# Patient Record
Sex: Male | Born: 1975 | Race: Black or African American | Hispanic: No | Marital: Married | Smoking: Current every day smoker
Health system: Southern US, Community
[De-identification: ages and names within clinical notes are randomized; demographics above are authoritative.]

## PROBLEM LIST (undated history)

## (undated) DIAGNOSIS — I10 Essential (primary) hypertension: Secondary | ICD-10-CM

---

## 2001-05-19 ENCOUNTER — Encounter (HOSPITAL_COMMUNITY): Admission: RE | Admit: 2001-05-19 | Discharge: 2001-06-18 | Payer: Self-pay | Admitting: Preventative Medicine

## 2001-05-29 ENCOUNTER — Encounter: Payer: Self-pay | Admitting: Preventative Medicine

## 2001-11-02 ENCOUNTER — Emergency Department (HOSPITAL_COMMUNITY): Admission: EM | Admit: 2001-11-02 | Discharge: 2001-11-02 | Payer: Self-pay | Admitting: Internal Medicine

## 2004-11-22 ENCOUNTER — Emergency Department (HOSPITAL_COMMUNITY): Admission: EM | Admit: 2004-11-22 | Discharge: 2004-11-22 | Payer: Self-pay | Admitting: Emergency Medicine

## 2009-10-20 ENCOUNTER — Emergency Department (HOSPITAL_COMMUNITY): Admission: EM | Admit: 2009-10-20 | Discharge: 2009-10-20 | Payer: Self-pay | Admitting: Emergency Medicine

## 2010-07-28 ENCOUNTER — Emergency Department (HOSPITAL_COMMUNITY): Admission: EM | Admit: 2010-07-28 | Discharge: 2010-07-28 | Payer: Self-pay | Admitting: Emergency Medicine

## 2010-08-13 ENCOUNTER — Emergency Department (HOSPITAL_COMMUNITY)
Admission: EM | Admit: 2010-08-13 | Discharge: 2010-08-13 | Payer: Self-pay | Source: Home / Self Care | Admitting: Emergency Medicine

## 2010-08-27 ENCOUNTER — Emergency Department (HOSPITAL_COMMUNITY)
Admission: EM | Admit: 2010-08-27 | Discharge: 2010-08-27 | Payer: Self-pay | Source: Home / Self Care | Admitting: Emergency Medicine

## 2010-10-18 ENCOUNTER — Emergency Department (HOSPITAL_COMMUNITY)
Admission: EM | Admit: 2010-10-18 | Discharge: 2010-10-18 | Disposition: A | Discharge status: Home / Self Care | Payer: Self-pay | Attending: Emergency Medicine | Admitting: Emergency Medicine

## 2010-10-18 DIAGNOSIS — Z760 Encounter for issue of repeat prescription: Secondary | ICD-10-CM | POA: Insufficient documentation

## 2010-10-18 DIAGNOSIS — I10 Essential (primary) hypertension: Secondary | ICD-10-CM | POA: Insufficient documentation

## 2010-10-18 DIAGNOSIS — K029 Dental caries, unspecified: Secondary | ICD-10-CM | POA: Insufficient documentation

## 2010-10-18 DIAGNOSIS — K089 Disorder of teeth and supporting structures, unspecified: Secondary | ICD-10-CM | POA: Insufficient documentation

## 2010-11-03 ENCOUNTER — Emergency Department (HOSPITAL_COMMUNITY)
Admission: EM | Admit: 2010-11-03 | Discharge: 2010-11-03 | Disposition: A | Discharge status: Home / Self Care | Payer: Self-pay | Attending: Emergency Medicine | Admitting: Emergency Medicine

## 2010-11-03 DIAGNOSIS — K029 Dental caries, unspecified: Secondary | ICD-10-CM | POA: Insufficient documentation

## 2010-11-03 DIAGNOSIS — K089 Disorder of teeth and supporting structures, unspecified: Secondary | ICD-10-CM | POA: Insufficient documentation

## 2010-11-03 DIAGNOSIS — I10 Essential (primary) hypertension: Secondary | ICD-10-CM | POA: Insufficient documentation

## 2010-11-03 DIAGNOSIS — R220 Localized swelling, mass and lump, head: Secondary | ICD-10-CM | POA: Insufficient documentation

## 2010-12-09 ENCOUNTER — Emergency Department (HOSPITAL_COMMUNITY)
Admission: EM | Admit: 2010-12-09 | Discharge: 2010-12-09 | Disposition: A | Discharge status: Home / Self Care | Payer: Self-pay | Attending: Emergency Medicine | Admitting: Emergency Medicine

## 2010-12-09 DIAGNOSIS — K089 Disorder of teeth and supporting structures, unspecified: Secondary | ICD-10-CM | POA: Insufficient documentation

## 2011-12-08 ENCOUNTER — Emergency Department (HOSPITAL_COMMUNITY)
Admission: EM | Admit: 2011-12-08 | Discharge: 2011-12-08 | Disposition: A | Discharge status: Home / Self Care | Payer: Self-pay | Attending: Emergency Medicine | Admitting: Emergency Medicine

## 2011-12-08 ENCOUNTER — Encounter (HOSPITAL_COMMUNITY): Payer: Self-pay | Admitting: Emergency Medicine

## 2011-12-08 DIAGNOSIS — J45909 Unspecified asthma, uncomplicated: Secondary | ICD-10-CM | POA: Insufficient documentation

## 2011-12-08 DIAGNOSIS — K0889 Other specified disorders of teeth and supporting structures: Secondary | ICD-10-CM

## 2011-12-08 DIAGNOSIS — R6884 Jaw pain: Secondary | ICD-10-CM | POA: Insufficient documentation

## 2011-12-08 DIAGNOSIS — K089 Disorder of teeth and supporting structures, unspecified: Secondary | ICD-10-CM | POA: Insufficient documentation

## 2011-12-08 DIAGNOSIS — K029 Dental caries, unspecified: Secondary | ICD-10-CM | POA: Insufficient documentation

## 2011-12-08 DIAGNOSIS — R51 Headache: Secondary | ICD-10-CM | POA: Insufficient documentation

## 2011-12-08 DIAGNOSIS — I10 Essential (primary) hypertension: Secondary | ICD-10-CM | POA: Insufficient documentation

## 2011-12-08 DIAGNOSIS — F17200 Nicotine dependence, unspecified, uncomplicated: Secondary | ICD-10-CM | POA: Insufficient documentation

## 2011-12-08 HISTORY — DX: Essential (primary) hypertension: I10

## 2011-12-08 MED ORDER — PENICILLIN V POTASSIUM 500 MG PO TABS: 500.0000 mg | ORAL_TABLET | Freq: Three times a day (TID) | ORAL | Status: AC

## 2011-12-08 MED ORDER — HYDROCODONE-ACETAMINOPHEN 5-325 MG PO TABS: ORAL_TABLET | ORAL | Status: AC

## 2011-12-08 MED ORDER — NAPROXEN 500 MG PO TABS: 500.0000 mg | ORAL_TABLET | Freq: Two times a day (BID) | ORAL | Status: AC

## 2011-12-08 NOTE — ED Notes (Signed)
Patient complaining of dental pain/swollen gums on upper right side.  Patient states that he has a tooth where a filling has fallen out; pain started tonight while he was at work.

## 2011-12-08 NOTE — Discharge Instructions (Signed)
Please read and follow all provided instructions.  Your diagnoses today include:  1. Pain, dental     Tests performed today include:  Vital signs. See below for your results today.   Medications prescribed:   Vicodin (hydrocodone/acetaminophen) - narcotic pain medication  You have been prescribed narcotic pain medication such as Vicodin, Percocet, or Ultram: DO NOT drive or perform any activities that require you to be awake and alert because this medicine can make you drowsy. BE VERY CAREFUL not to take multiple medicines containing Tylenol (also called acetaminophen). Doing so can lead to an overdose which can damage your liver and cause liver failure and possibly death.    Naproxen - anti-inflammatory pain medication  Do not exceed 500mg  naproxen every 12 hours  You have been prescribed an anti-inflammatory medication or NSAID. Take with food. Take smallest effective dose for the shortest duration needed for your pain. Stop taking if you experience stomach pain or vomiting.    Penicillin - antibiotic for dental infection  You have been prescribed an antibiotic medicine: take the entire course of medicine even if you are feeling better. Stopping early can cause the antibiotic not to work.  Take any prescribed medications only as directed.  Home care instructions:  Follow any educational materials contained in this packet.  Follow-up instructions: Please follow-up with your dentist for further evaluation of your symptoms. If you do not have a dentist or primary care doctor -- see below for referral information.   The exam and treatment you received today has been provided on an emergency basis only. This is not a substitute for complete medical or dental care. If your problem worsens or new symptoms (problems) appear, and you are unable to arrange prompt follow-up care with your dentist, return to this location.  Return instructions:   Please return to the Emergency  Department if you experience worsening symptoms.  Please return if you develop a fever, you develop more swelling in your face or neck, you have trouble breathing or swallowing food.  Please return if you have any other emergent concerns.  Additional Information:  Your vital signs today were: BP 177/115  Pulse 73  Temp(Src) 98.4 F (36.9 C) (Oral)  Resp 20  SpO2 100% If your blood pressure (BP) was elevated above 135/85 this visit, please have this repeated by your doctor within one month. -------------- No Primary Care Doctor Call [facility]  [phone] Other agencies that provide inexpensive medical care    [facility]  [phone]    [facility]  [phone]    [facility]  [phone]    [facility]  [phone]    [facility]  [phone]    [facility]  [phone] -------------- RESOURCE GUIDE:  Dental Problems  Patients with Medicaid: [facility] [phone]                                            [phone] [address] Phone:  [phone]  Phone:  [phone]  If unable to pay or uninsured, contact:  [facility] or [facility]. to become qualified for the adult dental clinic.  Chronic Pain Problems Contact [facility]  [phone] Patients need to be referred by their primary care doctor.  Insufficient Money for Medicine Contact [employer]:  call "211" or [facility] 3394369026.  Psychological Services [facility]  [phone] [facility]  [phone] [facility]   [phone] (emergency services [phone])  Substance Abuse Resources [facility]  [phone] [facility] [phone] [facility] [phone] Floydene Flock [phone] Residential & Outpatient Substance Abuse Program   [phone]  Abuse/Neglect [facility] Child Abuse Hotline 8582806533 [facility] Child Abuse Hotline [phone] (After Hours)  Emergency Shelter [facility] [phone]  Maternity Homes [facility] [place] [facility] [phone] [facility] [phone]  [facility] Resources  [facility]     [employer]                          [facility] Dept. [address] [place]                       [address]      [address]  Blondell Reveal Phone:  [phone]                                   Phone:  [phone]                 Phone:  [phone]  [facility] Phone:  [phone]  [facility] Child Abuse Hotline 2483448446 [phone] (After Hours)

## 2011-12-08 NOTE — ED Provider Notes (Signed)
Medical screening examination/treatment/procedure(s) were performed by non-physician practitioner and as supervising physician I was immediately available for consultation/collaboration.   Laray Anger, DO 12/08/11 [phone]

## 2011-12-08 NOTE — ED Provider Notes (Signed)
History     CSN: [id number]  Arrival date & time 12/08/11  0400   First MD Initiated Contact with Patient 12/08/11 [phone]      Chief Complaint  Patient presents with  . Dental Pain    (Consider location/radiation/quality/duration/timing/severity/associated sxs/prior treatment) HPI Comments: Patient presents with right upper jaw and tooth pain for the past several hours. It started while at work. Patient notes it is shooting sharp pain. No facial swelling. No neck swelling or shortness of breath. No treatments prior.  Patient is a 36 y.o. male presenting with tooth pain. The history is provided by the patient.  Dental PainThe primary symptoms include headaches. Primary symptoms do not include dental injury, fever, shortness of breath or sore throat. The symptoms began 2 to 6 hours ago. The symptoms are unchanged.  Additional symptoms do not include: gum swelling, facial swelling, trouble swallowing and ear pain.    Past Medical History  Diagnosis Date  . Asthma   . Hypertension     History reviewed. No pertinent past surgical history.  History reviewed. No pertinent family history.  History  Substance Use Topics  . Smoking status: Current Everyday Smoker -- 0.5 packs/day  . Smokeless tobacco: Not on file  . Alcohol Use: Yes     Rarely      Review of Systems  Constitutional: Negative for fever.  HENT: Positive for dental problem. Negative for ear pain, sore throat, facial swelling, trouble swallowing and neck pain.   Respiratory: Negative for shortness of breath and stridor.   Skin: Negative for color change.  Neurological: Positive for headaches.    Allergies  Lisinopril  Home Medications   Current Outpatient Rx  Name Route Sig Dispense Refill  . AMLODIPINE BESYLATE 5 MG PO TABS Oral Take 5 mg by mouth daily.    Marland Kitchen HYDROCHLOROTHIAZIDE 25 MG PO TABS Oral Take 25 mg by mouth daily.      BP 177/115  Pulse 73  Temp(Src) 98.4 F (36.9 C) (Oral)  Resp 20  SpO2  100%  Physical Exam  Nursing note and vitals reviewed. Constitutional: He is oriented to person, place, and time. He appears well-developed and well-nourished.  HENT:  Head: Normocephalic and atraumatic. No trismus in the jaw.  Right Ear: Tympanic membrane, external ear and ear canal normal.  Left Ear: Tympanic membrane, external ear and ear canal normal.  Nose: Nose normal.  Mouth/Throat: Uvula is midline, oropharynx is clear and moist and mucous membranes are normal. Abnormal dentition. Dental caries present. No dental abscesses or uvula swelling. No tonsillar abscesses.       Patient with R maxillary tooth pain and tenderness to palpation in area of molars. Tooth is fractured. No swelling or erythema noted on exam.  Eyes: Conjunctivae are normal. Pupils are equal, round, and reactive to light.  Neck: Normal range of motion. Neck supple.       No neck swelling or Lugwig's angina  Neurological: He is alert and oriented to person, place, and time.  Skin: Skin is warm and dry.  Psychiatric: He has a normal mood and affect.    ED Course  Procedures (including critical care time)  Labs Reviewed - No data to display No results found.   1. Pain, dental     6:35 AM Patient seen and examined.   Vital signs reviewed and are as follows: Filed Vitals:   12/08/11 0403  BP: 177/115  Pulse: 73  Temp: 98.4 F (36.9 C)  Resp: 20  Patient counseled to take prescribed medications as directed, return with worsening facial or neck swelling, and to follow-up with his dentist as soon as possible.   Patient counseled on use of narcotic pain medications. Counseled not to combine these medications with others containing tylenol. Urged not to drink alcohol, drive, or perform any other activities that requires focus while taking these medications. The patient verbalizes understanding and agrees with the plan.  MDM  Patient with toothache.  No gross abscess.  Exam unconcerning for Ludwig's angina  or other deep tissue infection in neck.  Will treat with penicillin and pain medicine.  Urged patient to follow-up with dentist.           Renne Crigler, PA 12/08/11 [phone]

## 2013-04-24 ENCOUNTER — Encounter (HOSPITAL_COMMUNITY): Payer: Self-pay

## 2013-04-24 ENCOUNTER — Emergency Department (HOSPITAL_COMMUNITY)
Admission: EM | Admit: 2013-04-24 | Discharge: 2013-04-24 | Disposition: A | Discharge status: Home / Self Care | Payer: No Typology Code available for payment source | Attending: Emergency Medicine | Admitting: Emergency Medicine

## 2013-04-24 DIAGNOSIS — I10 Essential (primary) hypertension: Secondary | ICD-10-CM | POA: Insufficient documentation

## 2013-04-24 DIAGNOSIS — R110 Nausea: Secondary | ICD-10-CM | POA: Insufficient documentation

## 2013-04-24 DIAGNOSIS — F17200 Nicotine dependence, unspecified, uncomplicated: Secondary | ICD-10-CM | POA: Insufficient documentation

## 2013-04-24 DIAGNOSIS — Z79899 Other long term (current) drug therapy: Secondary | ICD-10-CM | POA: Insufficient documentation

## 2013-04-24 DIAGNOSIS — H53149 Visual discomfort, unspecified: Secondary | ICD-10-CM | POA: Insufficient documentation

## 2013-04-24 DIAGNOSIS — R51 Headache: Secondary | ICD-10-CM | POA: Insufficient documentation

## 2013-04-24 DIAGNOSIS — G44209 Tension-type headache, unspecified, not intractable: Secondary | ICD-10-CM

## 2013-04-24 DIAGNOSIS — J45909 Unspecified asthma, uncomplicated: Secondary | ICD-10-CM | POA: Insufficient documentation

## 2013-04-24 MED ORDER — METOCLOPRAMIDE HCL 10 MG PO TABS: 10.0000 mg | ORAL_TABLET | Freq: Once | ORAL | Status: AC

## 2013-04-24 MED ORDER — DEXAMETHASONE SODIUM PHOSPHATE 10 MG/ML IJ SOLN
10.0000 mg | Freq: Once | INTRAMUSCULAR | Status: DC
  Filled 2013-04-24: qty 1

## 2013-04-24 MED ORDER — METOCLOPRAMIDE HCL 10 MG PO TABS
ORAL_TABLET | ORAL | Status: AC
  Administered 2013-04-24: 10 mg via ORAL
  Filled 2013-04-24: qty 1

## 2013-04-24 MED ORDER — METOCLOPRAMIDE HCL 5 MG/ML IJ SOLN
10.0000 mg | Freq: Once | INTRAMUSCULAR | Status: DC
  Filled 2013-04-24: qty 2

## 2013-04-24 MED ORDER — DIPHENHYDRAMINE HCL 50 MG/ML IJ SOLN
25.0000 mg | Freq: Once | INTRAMUSCULAR | Status: DC
  Filled 2013-04-24: qty 1

## 2013-04-24 MED ORDER — SODIUM CHLORIDE 0.9 % IV SOLN: 1000.0000 mL | Freq: Once | INTRAVENOUS | Status: DC

## 2013-04-24 MED ORDER — METOCLOPRAMIDE HCL 10 MG PO TABS: 10.0000 mg | ORAL_TABLET | Freq: Four times a day (QID) | ORAL | Status: DC | PRN

## 2013-04-24 MED ORDER — HYDROCHLOROTHIAZIDE 25 MG PO TABS: 25.0000 mg | ORAL_TABLET | Freq: Every day | ORAL | Status: DC

## 2013-04-24 MED ORDER — SODIUM CHLORIDE 0.9 % IV SOLN: 1000.0000 mL | INTRAVENOUS | Status: DC

## 2013-04-24 MED ORDER — AMLODIPINE BESYLATE 5 MG PO TABS: 5.0000 mg | ORAL_TABLET | Freq: Every day | ORAL | Status: DC

## 2013-04-24 NOTE — ED Notes (Signed)
Pt does not want IV, MD aware, PO order given

## 2013-04-24 NOTE — ED Notes (Signed)
Pt has had headache for 2 weeks, taking aleve and advil without relief, pt having facial  pressure and some chest congestion.

## 2013-04-24 NOTE — ED Notes (Signed)
Patient given discharge instruction, verbalized understand. Patient ambulatory out of the department.  

## 2013-04-24 NOTE — ED Notes (Signed)
Pt reports headache x 2 weeks, states he think is because he has been off of his bp med for quite some time.

## 2013-04-24 NOTE — ED Provider Notes (Signed)
CSN: [id number]     Arrival date & time 04/24/13  0130 History     First MD Initiated Contact with Patient 04/24/13 0330     Chief Complaint  Patient presents with  . Headache   (Consider location/radiation/quality/duration/timing/severity/associated sxs/prior Treatment) Patient is a 37 y.o. male presenting with headaches. The history is provided by the patient.  Headache He has had a bifrontal/bitemporal headache for about the last 2 weeks. Headache is there all day but does not keep him from sleeping. It is present when he wakes up. He describes it as a throbbing pain. Pain is currently 9/10 but it has been as severe as 10/10. There is associated photophobia and nausea without vomiting. He has taken over-the-counter pain medication which gave temporary relief but the headache comes right back. He denies visual disturbance, weakness, numbness. Of note, he has history of high blood pressure but ran out of his blood pressure medication about 8 or 9 months ago. He did not check his blood pressure since coming off of his medication until yesterday when he noted it was 160/116. He is a cigarette smoker smoking half pack of cigarettes a day.  Past Medical History  Diagnosis Date  . Asthma   . Hypertension    History reviewed. No pertinent past surgical history. No family history on file. History  Substance Use Topics  . Smoking status: Current Every Day Smoker -- 0.50 packs/day  . Smokeless tobacco: Not on file  . Alcohol Use: Yes     Comment: Rarely    Review of Systems  Neurological: Positive for headaches.  All other systems reviewed and are negative.    Allergies  Lisinopril  Home Medications   Current Outpatient Rx  Name  Route  Sig  Dispense  Refill  . amLODipine (NORVASC) 5 MG tablet   Oral   Take 5 mg by mouth daily.         . hydrochlorothiazide (HYDRODIURIL) 25 MG tablet   Oral   Take 25 mg by mouth daily.          BP 172/113  Pulse 94  Temp(Src) 98.9 F  (37.2 C) (Oral)  Resp 18  SpO2 99% Physical Exam  Nursing note and vitals reviewed.  37 year old male, resting comfortably and in no acute distress. Vital signs are significant for hypertension with blood pressure 172/113. Oxygen saturation is 99%, which is normal. Head is normocephalic and atraumatic. PERRLA, EOMI. Oropharynx is clear. Fundi show no hemorrhage, exudate, or papilledema. There is tenderness to palpation over the temporalis muscles and over the paracervical muscles bilaterally. Neck is nontender and supple without adenopathy or JVD. Back is nontender and there is no CVA tenderness. Lungs are clear without rales, wheezes, or rhonchi. Chest is nontender. Heart has regular rate and rhythm without murmur. Abdomen is soft, flat, nontender without masses or hepatosplenomegaly and peristalsis is normoactive. Extremities have no cyanosis or edema, full range of motion is present. Skin is warm and dry without rash. Neurologic: Mental status is normal, cranial nerves are intact, there are no motor or sensory deficits.  ED Course   Procedures (including critical care time)   1. Muscle contraction headache   2. Hypertension     MDM  Headache which seems most consistent with a muscle contraction headache. Hypertension which is poorly controlled. Old records are reviewed and his blood pressure was actually in a similar range 1 he had his last ED visit last year. She will be given a headache  cocktail to try and get symptomatic relief and he will be given a one-month supply of his blood pressure medications. Those medications are amlodipine and hydrochlorothiazide. He states his blood pressure was adequately controlled while he was taking them.  Patient is refused IV for headache cocktail. Is sent home with prescriptions for moderate pain and hydrochlorothiazide as well as metoclopramide and is advised to monitor his blood pressure as an outpatient.  Dione Booze, MD 04/24/13 [phone]

## 2013-08-03 ENCOUNTER — Emergency Department (HOSPITAL_COMMUNITY)
Admission: EM | Admit: 2013-08-03 | Discharge: 2013-08-03 | Discharge status: Against Medical Advice | Payer: Self-pay | Attending: Emergency Medicine | Admitting: Emergency Medicine

## 2013-08-03 ENCOUNTER — Emergency Department (HOSPITAL_COMMUNITY): Payer: Self-pay

## 2013-08-03 ENCOUNTER — Encounter (HOSPITAL_COMMUNITY): Payer: Self-pay | Admitting: Emergency Medicine

## 2013-08-03 DIAGNOSIS — Z720 Tobacco use: Secondary | ICD-10-CM

## 2013-08-03 DIAGNOSIS — Z79899 Other long term (current) drug therapy: Secondary | ICD-10-CM | POA: Insufficient documentation

## 2013-08-03 DIAGNOSIS — I509 Heart failure, unspecified: Secondary | ICD-10-CM

## 2013-08-03 DIAGNOSIS — I160 Hypertensive urgency: Secondary | ICD-10-CM

## 2013-08-03 DIAGNOSIS — I110 Hypertensive heart disease with heart failure: Secondary | ICD-10-CM | POA: Insufficient documentation

## 2013-08-03 DIAGNOSIS — F17200 Nicotine dependence, unspecified, uncomplicated: Secondary | ICD-10-CM | POA: Insufficient documentation

## 2013-08-03 DIAGNOSIS — R000 Tachycardia, unspecified: Secondary | ICD-10-CM | POA: Insufficient documentation

## 2013-08-03 DIAGNOSIS — J40 Bronchitis, not specified as acute or chronic: Secondary | ICD-10-CM

## 2013-08-03 DIAGNOSIS — J45901 Unspecified asthma with (acute) exacerbation: Secondary | ICD-10-CM | POA: Insufficient documentation

## 2013-08-03 LAB — CBC WITH DIFFERENTIAL/PLATELET
Basophils Absolute: 0 10*3/uL (ref 0.0–0.1)
Basophils Relative: 0 % (ref 0–1)
Lymphocytes Relative: 25 % (ref 12–46)
MCHC: 33.6 g/dL (ref 30.0–36.0)
Neutro Abs: 9.2 10*3/uL — ABNORMAL HIGH (ref 1.7–7.7)
Neutrophils Relative %: 67 % (ref 43–77)
Platelets: 179 10*3/uL (ref 150–400)
RDW: 15.7 % — ABNORMAL HIGH (ref 11.5–15.5)
WBC: 13.7 10*3/uL — ABNORMAL HIGH (ref 4.0–10.5)

## 2013-08-03 LAB — POCT I-STAT, CHEM 8
BUN: 12 mg/dL (ref 6–23)
Chloride: 103 mEq/L (ref 96–112)
HCT: 45 % (ref 39.0–52.0)
Sodium: 141 mEq/L (ref 135–145)

## 2013-08-03 LAB — POCT I-STAT TROPONIN I: Troponin i, poc: 0.03 ng/mL (ref 0.00–0.08)

## 2013-08-03 MED ORDER — IPRATROPIUM BROMIDE 0.02 % IN SOLN
0.5000 mg | Freq: Once | RESPIRATORY_TRACT | Status: AC
  Administered 2013-08-03: 0.5 mg via RESPIRATORY_TRACT
  Filled 2013-08-03: qty 2.5

## 2013-08-03 MED ORDER — AMLODIPINE BESYLATE 5 MG PO TABS
10.0000 mg | ORAL_TABLET | Freq: Once | ORAL | Status: AC
  Administered 2013-08-03: 10 mg via ORAL
  Filled 2013-08-03: qty 2

## 2013-08-03 MED ORDER — ALBUTEROL SULFATE (5 MG/ML) 0.5% IN NEBU
5.0000 mg | INHALATION_SOLUTION | Freq: Once | RESPIRATORY_TRACT | Status: AC
  Administered 2013-08-03: 5 mg via RESPIRATORY_TRACT
  Filled 2013-08-03: qty 1

## 2013-08-03 MED ORDER — FUROSEMIDE 40 MG PO TABS
40.0000 mg | ORAL_TABLET | Freq: Once | ORAL | Status: AC
  Administered 2013-08-03: 40 mg via ORAL
  Filled 2013-08-03: qty 1

## 2013-08-03 MED ORDER — FUROSEMIDE 20 MG PO TABS: 20.0000 mg | ORAL_TABLET | Freq: Every day | ORAL | Status: DC

## 2013-08-03 MED ORDER — AMLODIPINE BESYLATE 10 MG PO TABS: 10.0000 mg | ORAL_TABLET | Freq: Every day | ORAL | Status: DC

## 2013-08-03 NOTE — ED Notes (Signed)
Patient requesting to be discharged. Advised MD. Told patient that MD wants results from one more blood test. Patient verbalized understanding.

## 2013-08-03 NOTE — ED Notes (Signed)
Patient states he does not want to be admitted to hospital. States he wants to go home. Advised MD.

## 2013-08-03 NOTE — ED Provider Notes (Signed)
CSN: [id number]     Arrival date & time 08/03/13  1550 History   First MD Initiated Contact with Patient 08/03/13 1656     Chief Complaint  Patient presents with  . Shortness of Breath   (Consider location/radiation/quality/duration/timing/severity/associated sxs/prior Treatment) HPI Comments: Dillon Valdez is a 37 y.o. male who presents for evaluation of shortness of breath. It has been present for 2 days. He stopped his blood pressure medicines about one month ago when he ran out. He does not have a primary care doctor. He has a cough, productive of clear sputum. He denies weakness, dizziness, nausea, vomiting, chest pain, or back pain. He continues to smokes cigarettes. There are no other known modifying factors.   Patient is a 37 y.o. male presenting with shortness of breath. The history is provided by the patient.  Shortness of Breath   Past Medical History  Diagnosis Date  . Asthma   . Hypertension    History reviewed. No pertinent past surgical history. History reviewed. No pertinent family history. History  Substance Use Topics  . Smoking status: Current Every Day Smoker -- 0.50 packs/day  . Smokeless tobacco: Not on file  . Alcohol Use: Yes     Comment: Rarely    Review of Systems  Respiratory: Positive for shortness of breath.   All other systems reviewed and are negative.    Allergies  Lisinopril  Home Medications   Current Outpatient Rx  Name  Route  Sig  Dispense  Refill  . amLODipine (NORVASC) 5 MG tablet   Oral   Take 5 mg by mouth daily.         . diphenhydrAMINE (BENADRYL) 25 MG tablet   Oral   Take 25 mg by mouth daily as needed for itching or allergies.         . hydrochlorothiazide (HYDRODIURIL) 25 MG tablet   Oral   Take 25 mg by mouth daily.          BP 174/110  Pulse 86  Temp(Src) 99.9 F (37.7 C) (Oral)  Resp 20  Ht 6' (1.829 m)  Wt 230 lb (104.327 kg)  BMI 31.19 kg/m2  SpO2 98% Physical Exam  Nursing note and vitals  reviewed. Constitutional: He is oriented to person, place, and time. He appears well-developed and well-nourished.  HENT:  Head: Normocephalic and atraumatic.  Right Ear: External ear normal.  Left Ear: External ear normal.  Eyes: Conjunctivae and EOM are normal. Pupils are equal, round, and reactive to light.  Neck: Normal range of motion and phonation normal. Neck supple.  Cardiovascular: Regular rhythm, normal heart sounds and intact distal pulses.   Tachycardic  Pulmonary/Chest: Effort normal. He exhibits no bony tenderness.  Decreased level with scattered mild wheezing  Abdominal: Soft. Normal appearance. There is no tenderness.  Musculoskeletal: Normal range of motion.  Neurological: He is alert and oriented to person, place, and time. No cranial nerve deficit or sensory deficit. He exhibits normal muscle tone. Coordination normal.  Skin: Skin is warm, dry and intact.  Psychiatric: He has a normal mood and affect. His behavior is normal. Judgment and thought content normal.    ED Course  Procedures (including critical care time) Doubt metabolic instability, serious bacterial infection or impending vascular collapse; the patient is stable for discharge.  Patient Vitals for the past 24 hrs:  BP Temp Temp src Pulse Resp SpO2 Height Weight  08/03/13 2016 154/105 mmHg 98.6 F (37 C) Oral 86 20 100 % - -  08/03/13 1840 174/110 mmHg - - 86 20 98 % - -  08/03/13 1739 - - - - - 97 % - -  08/03/13 1557 166/117 mmHg 99.9 F (37.7 C) Oral 109 20 100 % 6' (1.829 m) 230 lb (104.327 kg)     8:23 PM Reevaluation with update and discussion. After initial assessment and treatment, an updated evaluation reveals patient continues to feel well. 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 Because of this, he needs to be admitted for further treatment, evaluation and stabilization. The patient decided he wanted to leave. I informed him  that this is a dangerous situation and could result in his dying. He understands that. He continues to want to leave AGAINST MEDICAL ADVICE. He will be treated with oral medications to hopefully improve his condition. He understands that he needs to see a primary care doctor as soon as possible. He, states that he has insurance and will be able to find one. [name] L      Labs Review Labs Reviewed  CBC WITH DIFFERENTIAL - Abnormal; Notable for the following:    WBC 13.7 (*)    Hemoglobin 12.6 (*)    HCT 37.5 (*)    MCV 72.4 (*)    MCH 24.3 (*)    RDW 15.7 (*)    Neutro Abs 9.2 (*)    Monocytes Absolute 1.1 (*)    All other components within normal limits  POCT I-STAT, CHEM 8  POCT I-STAT TROPONIN I   Imaging Review Dg Chest 2 View  08/03/2013   CLINICAL DATA:  Shortness of breath for 2 days, hypertension, asthma, smoker  EXAM: CHEST  2 VIEW  COMPARISON:  None  FINDINGS: Enlargement of cardiac silhouette with pulmonary vascular congestion.  Scattered infiltrates in the mid to lower lungs bilaterally question mild edema though infection is not excluded.  No pleural effusion or pneumothorax.  Bones unremarkable.  IMPRESSION: Enlargement of cardiac silhouette with pulmonary vascular congestion and scattered infiltrates in the mid perihilar to basilar regions bilaterally, question pulmonary edema though infection is not completely excluded.   Electronically Signed   By: Ulyses Southward M.D.   On: 08/03/2013 17:25    EKG Interpretation    Date/Time:  Monday August 03 2013 17:04:27 EST Ventricular Rate:  93 PR Interval:  174 QRS Duration: 104 QT Interval:  376 QTC Calculation: 467 R Axis:   -21 Text Interpretation:  Sinus rhythm with occasional Premature ventricular complexes Possible Left atrial enlargement Left ventricular hypertrophy T wave abnormality, consider lateral ischemia Prolonged QT Abnormal ECG Since last tracing changes c/w early repolarization are now present Confirmed  by Erskine Steinfeldt  MD, Allysson Rinehimer ([id number]) on 08/03/2013 6:32:44 PM            MDM  No diagnosis found.   Hypertension with congestive heart failure, consistent with hypertensive urgency. The patient has chosen to leave AGAINST MEDICAL ADVICE. He will be discharged with medications to hopefully improve his condition   Nursing Notes Reviewed/ Care Coordinated, and agree without changes. Applicable Imaging Reviewed.  Interpretation of Laboratory Data incorporated into ED treatment    Plan: Home Medications- amlodipine, Lasix; Home Treatments and Observation- stop smoking; return here if the recommended treatment, does not improve the symptoms; Recommended follow up- follow up with her primary care doctor choice as soon as possible. Return here if needed     Flint Melter, MD 08/03/13 2031

## 2013-08-03 NOTE — ED Notes (Addendum)
Sob for 2 days, Hx of asthma as a child,  Had episode of sob 2 mos ago. Does not have any inhalers.  Has not taken any med for bp in 1 week

## 2014-05-03 ENCOUNTER — Emergency Department (HOSPITAL_COMMUNITY): Payer: PRIVATE HEALTH INSURANCE

## 2014-05-03 ENCOUNTER — Emergency Department (HOSPITAL_COMMUNITY)
Admission: EM | Admit: 2014-05-03 | Discharge: 2014-05-03 | Disposition: A | Discharge status: Home / Self Care | Payer: PRIVATE HEALTH INSURANCE | Attending: Emergency Medicine | Admitting: Emergency Medicine

## 2014-05-03 ENCOUNTER — Encounter (HOSPITAL_COMMUNITY): Payer: Self-pay | Admitting: Emergency Medicine

## 2014-05-03 DIAGNOSIS — J45901 Unspecified asthma with (acute) exacerbation: Secondary | ICD-10-CM | POA: Insufficient documentation

## 2014-05-03 DIAGNOSIS — Z91199 Patient's noncompliance with other medical treatment and regimen due to unspecified reason: Secondary | ICD-10-CM | POA: Insufficient documentation

## 2014-05-03 DIAGNOSIS — Z791 Long term (current) use of non-steroidal anti-inflammatories (NSAID): Secondary | ICD-10-CM | POA: Insufficient documentation

## 2014-05-03 DIAGNOSIS — Z79899 Other long term (current) drug therapy: Secondary | ICD-10-CM | POA: Insufficient documentation

## 2014-05-03 DIAGNOSIS — R42 Dizziness and giddiness: Secondary | ICD-10-CM | POA: Insufficient documentation

## 2014-05-03 DIAGNOSIS — R109 Unspecified abdominal pain: Secondary | ICD-10-CM | POA: Insufficient documentation

## 2014-05-03 DIAGNOSIS — Z9119 Patient's noncompliance with other medical treatment and regimen: Secondary | ICD-10-CM | POA: Insufficient documentation

## 2014-05-03 DIAGNOSIS — F17200 Nicotine dependence, unspecified, uncomplicated: Secondary | ICD-10-CM | POA: Insufficient documentation

## 2014-05-03 DIAGNOSIS — I10 Essential (primary) hypertension: Secondary | ICD-10-CM

## 2014-05-03 LAB — BASIC METABOLIC PANEL
Anion gap: 10 (ref 5–15)
BUN: 14 mg/dL (ref 6–23)
CHLORIDE: 101 meq/L (ref 96–112)
CO2: 29 meq/L (ref 19–32)
CREATININE: 1.09 mg/dL (ref 0.50–1.35)
Calcium: 9.2 mg/dL (ref 8.4–10.5)
GFR calc Af Amer: >90 mL/min (ref >90)
GFR calc non Af Amer: 85 mL/min — ABNORMAL LOW (ref >90)
Glucose, Bld: 95 mg/dL (ref 70–99)
Potassium: 4.1 mEq/L (ref 3.7–5.3)
Sodium: 140 mEq/L (ref 137–147)

## 2014-05-03 LAB — CBC WITH DIFFERENTIAL/PLATELET
BASOS PCT: 0 % (ref 0–1)
Basophils Absolute: 0 10*3/uL (ref 0.0–0.1)
Eosinophils Absolute: 0.2 10*3/uL (ref 0.0–0.7)
Eosinophils Relative: 2 % (ref 0–5)
HEMATOCRIT: 38 % — AB (ref 39.0–52.0)
HEMOGLOBIN: 12.7 g/dL — AB (ref 13.0–17.0)
Lymphocytes Relative: 31 % (ref 12–46)
Lymphs Abs: 3.2 10*3/uL (ref 0.7–4.0)
MCH: 23.7 pg — ABNORMAL LOW (ref 26.0–34.0)
MCHC: 33.4 g/dL (ref 30.0–36.0)
MCV: 70.9 fL — ABNORMAL LOW (ref 78.0–100.0)
MONO ABS: 0.9 10*3/uL (ref 0.1–1.0)
MONOS PCT: 8 % (ref 3–12)
NEUTROS ABS: 6 10*3/uL (ref 1.7–7.7)
Neutrophils Relative %: 59 % (ref 43–77)
Platelets: 161 10*3/uL (ref 150–400)
RBC: 5.36 MIL/uL (ref 4.22–5.81)
RDW: 15.9 % — ABNORMAL HIGH (ref 11.5–15.5)
WBC: 10.2 10*3/uL (ref 4.0–10.5)

## 2014-05-03 MED ORDER — FUROSEMIDE 20 MG PO TABS
20.0000 mg | ORAL_TABLET | Freq: Every day | ORAL | Status: DC
Start: 2014-05-03 — End: 2014-08-10

## 2014-05-03 MED ORDER — AMLODIPINE BESYLATE 10 MG PO TABS: 10.0000 mg | ORAL_TABLET | Freq: Every day | ORAL | Status: DC

## 2014-05-03 NOTE — Discharge Instructions (Signed)
Hypertension °Hypertension, commonly called high blood pressure, is when the force of blood pumping through your arteries is too strong. Your arteries are the blood vessels that carry blood from your heart throughout your body. A blood pressure reading consists of a higher number over a lower number, such as 110/72. The higher number (systolic) is the pressure inside your arteries when your heart pumps. The lower number (diastolic) is the pressure inside your arteries when your heart relaxes. Ideally you want your blood pressure below 120/80. °Hypertension forces your heart to work harder to pump blood. Your arteries may become narrow or stiff. Having hypertension puts you at risk for heart disease, stroke, and other problems.  °RISK FACTORS °Some risk factors for high blood pressure are controllable. Others are not.  °Risk factors you cannot control include:  °· Race. You may be at higher risk if you are African American. °· Age. Risk increases with age. °· Gender. Men are at higher risk than women before age 45 years. After age 65, women are at higher risk than men. °Risk factors you can control include: °· Not getting enough exercise or physical activity. °· Being overweight. °· Getting too much fat, sugar, calories, or salt in your diet. °· Drinking too much alcohol. °SIGNS AND SYMPTOMS °Hypertension does not usually cause signs or symptoms. Extremely high blood pressure (hypertensive crisis) may cause headache, anxiety, shortness of breath, and nosebleed. °DIAGNOSIS  °To check if you have hypertension, your health care provider will measure your blood pressure while you are seated, with your arm held at the level of your heart. It should be measured at least twice using the same arm. Certain conditions can cause a difference in blood pressure between your right and left arms. A blood pressure reading that is higher than normal on one occasion does not mean that you need treatment. If one blood pressure reading  is high, ask your health care provider about having it checked again. °TREATMENT  °Treating high blood pressure includes making lifestyle changes and possibly taking medicine. Living a healthy lifestyle can help lower high blood pressure. You may need to change some of your habits. °Lifestyle changes may include: °· Following the DASH diet. This diet is high in fruits, vegetables, and whole grains. It is low in salt, red meat, and added sugars. °· Getting at least 2½ hours of brisk physical activity every week. °· Losing weight if necessary. °· Not smoking. °· Limiting alcoholic beverages. °· Learning ways to reduce stress. ° If lifestyle changes are not enough to get your blood pressure under control, your health care provider may prescribe medicine. You may need to take more than one. Work closely with your health care provider to understand the risks and benefits. °HOME CARE INSTRUCTIONS °· Have your blood pressure rechecked as directed by your health care provider.   °· Take medicines only as directed by your health care provider. Follow the directions carefully. Blood pressure medicines must be taken as prescribed. The medicine does not work as well when you skip doses. Skipping doses also puts you at risk for problems.   °· Do not smoke.   °· Monitor your blood pressure at home as directed by your health care provider.  °SEEK MEDICAL CARE IF:  °· You think you are having a reaction to medicines taken. °· You have recurrent headaches or feel dizzy. °· You have swelling in your ankles. °· You have trouble with your vision. °SEEK IMMEDIATE MEDICAL CARE IF: °· You develop a severe headache or confusion. °·   You have unusual weakness, numbness, or feel faint. °· You have severe chest or abdominal pain. °· You vomit repeatedly. °· You have trouble breathing. °MAKE SURE YOU:  °· Understand these instructions. °· Will watch your condition. °· Will get help right away if you are not doing well or get worse. °Document  Released: 08/27/2005 Document Revised: 01/11/2014 Document Reviewed: 06/19/2013 °ExitCare® Patient Information ©2015 [employer]. This information is not intended to replace advice given to you by your health care provider. Make sure you discuss any questions you have with your health care provider. ° ° °Emergency Department Resource Guide °1) Find a Doctor and Pay Out of Pocket °Although you won't have to find out who is covered by your insurance plan, it is a good idea to ask around and get recommendations. You will then need to call the office and see if the doctor you have chosen will accept you as a new patient and what types of options they offer for patients who are self-pay. Some doctors offer discounts or will set up payment plans for their patients who do not have insurance, but you will need to ask so you aren't surprised when you get to your appointment. ° °2) Contact Your Local Health Department °Not all health departments have doctors that can see patients for sick visits, but many do, so it is worth a call to see if yours does. If you don't know where your local health department is, you can check in your phone book. The CDC also has a tool to help you locate your state's health department, and many state websites also have listings of all of their local health departments. ° °3) Find a Walk-in Clinic °If your illness is not likely to be very severe or complicated, you may want to try a walk in clinic. These are popping up all over the country in pharmacies, drugstores, and shopping centers. They're usually staffed by nurse practitioners or physician assistants that have been trained to treat common illnesses and complaints. They're usually fairly quick and inexpensive. However, if you have serious medical issues or chronic medical problems, these are probably not your best option. ° °No Primary Care Doctor: °- Call [facility] at  832-8000 - they can help you locate a primary care doctor that   accepts your insurance, provides certain services, etc. °- Physician Referral Service- 1-800-533-3463 ° °Chronic Pain Problems: °Organization         Address  Phone   Notes  °[facility]  (336) 297-2271 Patients need to be referred by their primary care doctor.  ° °Medication Assistance: °Organization         Address  Phone   Notes  °[place] [facility] [address] °[place], [place] [address] (336) 641-8030 --Must be a resident of [place] °-- Must have NO insurance coverage whatsoever (no Medicaid/ Medicare, etc.) °-- The pt. MUST have a primary care doctor that directs their care regularly and follows them in the community °  °[facility]  (866) 331-1348   °[employer]  (888) 892-1162   ° °Agencies that provide inexpensive medical care: °Organization         Address  Phone   Notes  °[facility]  (336) 832-8035   °[facility]    (336) 832-7272   °[facility] [address] °[place], [place] [address] (336) 832-4777   °[facility] [address], °  [place] (336) 271-4999   °[facility]    (336) 373-0678   °[facility]    (336) 272-1050   °[facility] ° [address], [place] Phone:  (336) 832-4444, Fax:  (336) 832-4440 Hours of Operation:  9 am - 6 pm, M-F.  Also accepts Medicaid/Medicare and self-pay.  °[facility] ° [address], [place] Phone: (336) [phone], Fax: (336) [phone]. Hours of Operation:  8:30 am - 5:30 pm, M-F.  Also accepts Medicaid and self-pay.  °[facility] [place] [address], [place] Phone: (336) 878-6027   °[facility] [address], [place], [place] (336)723-1848, Ext. 123 Mondays & Thursdays: 7-9 AM.  First 15 patients are seen on a first come, first serve basis. °  ° °Medicaid-accepting [place] Providers: ° °Organization          Address  Phone   Notes  °[facility] [address], [place] (336) 641-2100 Also accepts self-pay patients.  °[facility] [address], [place] ° (336) 856-9996   °[facility] [address], [place] (336) 288-8857   °[facility] [address] [place] [address], [place] (336) 299-7000   °Veita Bland [address], [place]  ° (336) 373-1557 Only accepts [place] Access Medicaid patients after they have their name applied to their card.  ° °Self-Pay (no insurance) in [place]: ° °Organization         Address  Phone   Notes  °Sickle Cell Patients, [facility] [address], [place] (336) 832-1970   °[facility] [address], [place] (336) 832-4400   ° [facility] ° [address], [place] (336) 992-4800   °[facility]/Dr. Osei-Bonsu ° [address] [place] [address], [place] or [address], [place] (336) 841-8500 Phone number for both [place] and [place] locations is the same.  °[facility] [address], [place] (336) 299-0000   °[facility] [address] [place] [address], [place] or [address] (336) 852-7530 °(336) 878-2260   °[facility] [address], [place] (336) 350-1642, phone; (336) 294-5005, fax Sees patients 1st and 3rd Saturday of every month.  Must not qualify for public or private insurance (i.e. Medicaid, Medicare, [place] Health Choice, Veterans' Benefits) • Household income should be no more than 200% of the poverty level •The clinic cannot treat you if you are pregnant or think you are pregnant • Sexually transmitted diseases are not treated at the clinic.  ° ° °Dental Care: °Organization         Address  Phone  Notes  °[facility] [address],   (336) 641-6152 Accepts children up to age 21 who are enrolled in Medicaid or [place] Health Choice; pregnant women with a Medicaid card; and children who have applied for Medicaid or [place] Health Choice, but were declined, whose parents can pay a reduced fee at time of service.  °[facility] [place]  [address], [place] (336) 641-7733 Accepts children up to age 21 who are enrolled in Medicaid or [place] Health Choice; pregnant women with a Medicaid card; and children who have applied for Medicaid or    Health Choice, but were declined, whose parents can pay a reduced fee at time of service.  °[facility] ° [address], [place] (336) 641-4533 Patients are seen by appointment only. Walk-ins are not accepted. [facility] will see patients 18 years of age and older. °Monday - Tuesday (8am-5pm) °Most Wednesdays (8:30-5pm) °$30 per visit, cash only  °[facility] ° [address], [place] (336) 641-4533 Patients are seen by appointment only. Walk-ins are not accepted. [facility] will see patients 18 years of age and older. °One Wednesday Evening (Monthly: Volunteer Based).  $30 per visit, cash only  °[facility]  (919) [phone] for adults; Children under age 4, call Graduate Pediatric Dentistry at ([phone]) [phone]. Children aged 4-14, please call ([phone]) [phone] to request a pediatric application. ° Dental services are provided in all areas of dental care including fillings, crowns and bridges, complete and partial dentures, implants, gum treatment, root canals, and extractions. Preventive care is also provided. Treatment is provided to both adults and children. °Patients are selected via a lottery and there is often a waiting list. °  °[facility] [address], °[place] ° (336) 763-8833 [email or web address] °  °[facility] [address], [place], [place]  (336)723-1848, Ext. 123 Second and Fourth Thursday of each month, opens at 6:30 AM; Clinic ends at 9 AM.  Patients are seen on a first-come first-served basis, and a limited number are seen during each clinic.  ° °[facility] ° [address], [place], [place] (336) 723-7904   Eligibility Requirements °You must have lived in [place], [place], or [place] counties for at least the last three months. °  You cannot be eligible for state or federal sponsored healthcare insurance, including Veterans Administration, Medicaid, or Medicare. °  You generally cannot be eligible for healthcare insurance through your employer.  °  How to apply: °Eligibility screenings are held every Tuesday and Wednesday afternoon from 1:00 pm until 4:00 pm. You do not need an appointment for the interview!  °[facility] [address], [place], [place] 336-631-2330   °[facility]  336-342-8273   °[facility]  336-703-3100   °[facility]  336-570-6415   ° °Behavioral Health Resources in the Community: °Intensive Outpatient Programs °Organization         Address  Phone  Notes  °[facility] [address], [place], [place] 336-878-6098   °[facility] [address], [place], [place] 336-832-9800   °[facility] [address], [place] ° 336-882-2125   °[facility] [address],  °[place], [place] 1-800-853-5163 or 336-641-4981   °Substance Abuse Resources °Organization         Address  Phone  Notes  °[facility]  336-882-2125   °[facility]  336-784-9470   °[facility]  336-285-9073   °[facility]  336-845-3988   °Residential & Outpatient Substance Abuse Program  1-800-659-3381   °Psychological Services °Organization         Address  Phone  Notes  °[facility]  336- 832-9600   °[facility]  336- 378-7881    °[facility] [address], [place] 1-800-853-5163 or 336-641-4981   ° °Mobile Crisis Teams °Organization         Address  Phone    Notes  [facility], Mobile Crisis Care Unit  684-283-0755   [facility]  [address] [place], [place] 478-295-6213   [facility] [address] [place] 086-578-4696    Self-Help/Support Groups Organization         Address  Phone             Notes  [facility] - variety of support groups  336- I7437963 Call for more information  Narcotics Anonymous (NA), [facility] [address], [employer] [place]  2 meetings at this location   Statistician         Address  Phone  Notes  [facility] [address],    [place]  2-952-841-3244   [facility]  [address] [place] [address], [place], [place] 536-644-0347   [facility] [address] [place], [place] [phone] Admissions: 8am-3pm M-F  [facility] [address]    [place], [place] 643-329-5188   [facility] [address] [place], [place], [place] 416-606-3016   [facility] [address]  [place], [place] [phone]   [facility] Intensive Outpatient [address], [place], [place] 322-025-4270   [facility] ([facility] [address] [place]  [place], [place] 6-237-628-3151 or 347-016-0996   [facility] [address], [place], [place] 626-948-5462 Accepts Medicaid  [facility] [address],  [place] 7-035-009-3818 Substance Abuse/Addiction Treatment   [facility]         Address  Phone  Notes  [facility]  647-737-3715   Angie Fava, PhD [address] [place], [place]   (952)871-0579 or 250 800 8526   [facility]   [address] [place], [place] 585-673-3338   [facility] [address] [place], [place] (504)350-0493 Insurance/Medicaid/sponsorship through [facility] [address]                                    [employer], [place] (302) [phone] Therapy/tele-psych/case  [facility] [address] [place], [place] (516)625-5457    Dr. Lolly Mustache  236-155-5452   [facility]  [employer] [facility] Dept. 1) [address], [place] 2) [address], [place] 3)  [address], [place] (680)[phone]  714-449-3084   [facility] Child Abuse Hotline 203-228-2869 or 959-159-1795 (After Hours)      Resource guide provided to help you find a primary care Dr. Donn Pierini free clinic of [facility] is listed as well as a [facility]. These are to places where you can get your blood pressure followed for that her control. Your heart is showing signs of enlargement and your lungs are showing some mild signs of the buildup of fluid from the blood pressure being too high for too long. Proper control of your blood pressure is important to prevent heart disease fluid on the lungs and kidney disease. Currently her renal function is normal.

## 2014-05-03 NOTE — ED Notes (Signed)
Pt c/o HTN, not complaint with HTN meds, states syncopal episode last week, feels strange feeling to head at present

## 2014-05-03 NOTE — ED Notes (Signed)
Pt alert & oriented x4, stable gait. Patient given discharge instructions, paperwork & prescription(s). Patient  instructed to stop at the registration desk to finish any additional paperwork. Patient verbalized understanding. Pt left department w/ no further questions. 

## 2014-05-03 NOTE — ED Provider Notes (Signed)
CSN: [id number]     Arrival date & time 05/03/14  1549 History   This chart was scribed for Dillon Mulders, MD, by Yevette Edwards, ED Scribe. This patient was seen in room [facility]08/[facility]08 and the patient's care was started at 6:52 PM.  First MD Initiated Contact with Patient 05/03/14 1829     Chief Complaint  Patient presents with  . Hypertension   Patient is a 38 y.o. male presenting with hypertension. The history is provided by the patient. No language interpreter was used.  Hypertension This is a chronic problem. The current episode started more than 1 week ago. The problem occurs every several days. The problem has not changed since onset.Associated symptoms include abdominal pain, headaches and shortness of breath. Pertinent negatives include no chest pain. Nothing aggravates the symptoms. The symptoms are relieved by medications. The treatment provided moderate relief.   HPI Comments: Dillon Valdez is a 38 y.o. male, with a h/o HTN, who presents to the Emergency Department complaining of elevated BP.  In the ED his BP is 157/95. Mr. Coye reports he had a near-syncopal episode last week and again today. He also endorses lightheadedness and a frontal headache which he rates as 8/10.  He denies weakness or numbness to his extremities.  To control his HTN , he uses Norvasc 10 mg daily and Lasix 20 mg daily; he finished his prescription today.  He admits noncompliance with his medication. Mr. Damon also states he has experienced intermittent mild abdominal pain; he denies nausea, emesis, or diarrhea. The pt is a current smoker.   Past Medical History  Diagnosis Date  . Asthma   . Hypertension    History reviewed. No pertinent past surgical history. History reviewed. No pertinent family history. History  Substance Use Topics  . Smoking status: Current Every Day Smoker -- 0.50 packs/day    Types: Cigarettes  . Smokeless tobacco: Not on file  . Alcohol Use: Yes     Comment: Rarely     Review of Systems  Constitutional: Negative for fever and chills.  HENT: Negative for congestion, rhinorrhea, sneezing and sore throat.   Eyes: Negative for visual disturbance.  Respiratory: Positive for cough and shortness of breath.   Cardiovascular: Negative for chest pain and leg swelling.  Gastrointestinal: Positive for abdominal pain. Negative for nausea, vomiting and diarrhea.  Genitourinary: Negative for dysuria.  Musculoskeletal: Negative for back pain.  Skin: Negative for rash.  Neurological: Positive for light-headedness and headaches. Negative for weakness and numbness.  Hematological: Does not bruise/bleed easily.  Psychiatric/Behavioral: Negative for confusion.     Allergies  Lisinopril  Home Medications   Prior to Admission medications   Medication Sig Start Date End Date Taking? Authorizing Provider  amLODipine (NORVASC) 10 MG tablet Take 1 tablet (10 mg total) by mouth daily. 08/03/13  Yes Flint Melter, MD  furosemide (LASIX) 20 MG tablet Take 1 tablet (20 mg total) by mouth daily. 08/03/13  Yes Flint Melter, MD  ibuprofen (ADVIL,MOTRIN) 200 MG tablet Take 400 mg by mouth daily as needed for headache.   Yes Historical Provider, MD  amLODipine (NORVASC) 10 MG tablet Take 1 tablet (10 mg total) by mouth daily. 05/03/14   Dillon Mulders, MD  furosemide (LASIX) 20 MG tablet Take 1 tablet (20 mg total) by mouth daily. 05/03/14   Dillon Mulders, MD   Triage Vitals: BP 157/95  Pulse 91  Temp(Src) 98.4 F (36.9 C) (Oral)  Resp 20  Ht 6' (1.829 m)  Wt 211 lb (95.709 kg)  BMI 28.61 kg/m2  SpO2 97%  Physical Exam  Nursing note and vitals reviewed. Constitutional: He is oriented to person, place, and time. He appears well-developed and well-nourished. No distress.  HENT:  Head: Normocephalic and atraumatic.  Eyes: Conjunctivae and EOM are normal.  Neck: Neck supple. No tracheal deviation present.  Cardiovascular: Normal rate, regular rhythm and normal  heart sounds.   No murmur heard. Pulmonary/Chest: Effort normal and breath sounds normal. No respiratory distress. He has no wheezes. He has no rales.  Abdominal: Bowel sounds are normal. There is no tenderness.  Musculoskeletal: Normal range of motion. He exhibits no edema.  Neurological: He is alert and oriented to person, place, and time. No cranial nerve deficit. Coordination normal.  Skin: Skin is warm and dry.  Psychiatric: He has a normal mood and affect. His behavior is normal.    ED Course  Procedures (including critical care time)  DIAGNOSTIC STUDIES: Oxygen Saturation is 97% on room air, normal by my interpretation.    COORDINATION OF CARE:  6:55 PM- Discussed treatment plan with patient, and the patient agreed to the plan. The plan includes lab work, a CT scan, and renewal of prescription medication. Also advised pt to follow-up with the health department or the free clinic re maintenance of his HTN.   Results for orders placed during the hospital encounter of 05/03/14  CBC WITH DIFFERENTIAL      Result Value Ref Range   WBC 10.2  4.0 - 10.5 K/uL   RBC 5.36  4.22 - 5.81 MIL/uL   Hemoglobin 12.7 (*) 13.0 - 17.0 g/dL   HCT 16.1 (*) 09.6 - 04.5 %   MCV 70.9 (*) 78.0 - 100.0 fL   MCH 23.7 (*) 26.0 - 34.0 pg   MCHC 33.4  30.0 - 36.0 g/dL   RDW 40.9 (*) 81.1 - 91.4 %   Platelets 161  150 - 400 K/uL   Neutrophils Relative % 59  43 - 77 %   Neutro Abs 6.0  1.7 - 7.7 K/uL   Lymphocytes Relative 31  12 - 46 %   Lymphs Abs 3.2  0.7 - 4.0 K/uL   Monocytes Relative 8  3 - 12 %   Monocytes Absolute 0.9  0.1 - 1.0 K/uL   Eosinophils Relative 2  0 - 5 %   Eosinophils Absolute 0.2  0.0 - 0.7 K/uL   Basophils Relative 0  0 - 1 %   Basophils Absolute 0.0  0.0 - 0.1 K/uL  BASIC METABOLIC PANEL      Result Value Ref Range   Sodium 140  137 - 147 mEq/L   Potassium 4.1  3.7 - 5.3 mEq/L   Chloride 101  96 - 112 mEq/L   CO2 29  19 - 32 mEq/L   Glucose, Bld 95  70 - 99 mg/dL   BUN  14  6 - 23 mg/dL   Creatinine, Ser 7.82  0.50 - 1.35 mg/dL   Calcium 9.2  8.4 - 95.6 mg/dL   GFR calc non Af Amer 85 (*) >90 mL/min   GFR calc Af Amer >90  >90 mL/min   Anion gap 10  5 - 15   Dg Chest 2 View  05/03/2014   CLINICAL DATA:  Shortness of breath. Smoker. History of asthma and hypertension.  EXAM: CHEST  2 VIEW  COMPARISON:  08/03/2013.  FINDINGS: Enlarged cardiac silhouette with a mild interval decrease in size. Prominent pulmonary vasculature  and interstitial markings with improvement. No pleural fluid. Mild thoracic spine degenerative changes.  IMPRESSION: Cardiomegaly and mild changes of congestive heart failure with improvement.   Electronically Signed   By: Gordan Payment M.D.   On: 05/03/2014 20:37   Ct Head Wo Contrast  05/03/2014   CLINICAL DATA:  Headache. Syncopal episode last week. Hypertension.  EXAM: CT HEAD WITHOUT CONTRAST  TECHNIQUE: Contiguous axial images were obtained from the base of the skull through the vertex without intravenous contrast.  COMPARISON:  None.  FINDINGS: Normal appearing cerebral hemispheres and posterior fossa structures. Normal size and position of the ventricles. No intracranial hemorrhage, mass lesion or CT evidence of acute infarction. Unremarkable bones and included paranasal sinuses.  IMPRESSION: Normal examination.   Electronically Signed   By: Gordan Payment M.D.   On: 05/03/2014 20:30     Medications - No data to display     EKG Interpretation   Date/Time:  Monday May 03 2014 19:23:40 EDT Ventricular Rate:  82 PR Interval:  81 QRS Duration: 108 QT Interval:  396 QTC Calculation: 462 R Axis:   42 Text Interpretation:  Sinus or ectopic atrial rhythm Short PR interval  Consider anterolateral infarct Borderline T abnormalities, inferior leads  Baseline wander in lead(s) V3 No significant change since last tracing  Confirmed by Akylah Hascall  MD, Dalisha Shively ([id number]) on 05/03/2014 7:40:15 PM      MDM   Final diagnoses:  Essential  hypertension    Patient with poorly controlled blood pressure. The workup for the headache without any significant abnormalities. Patient is showing no signs of an enlarged heart and some mild congestive heart failure. Patient's medications the Norvasc and the Lasix renewed and referral to health department and free clinic information provided. Patient in no acute distress. Blood pressure reasonably controlled currently at 157/95 for tighter control would be beneficial to the patient.  I personally performed the services described in this documentation, which was scribed in my presence. The recorded information has been reviewed and is accurate.     Dillon Mulders, MD 05/03/14 2053

## 2014-07-25 ENCOUNTER — Emergency Department (HOSPITAL_COMMUNITY)
Admission: EM | Admit: 2014-07-25 | Discharge: 2014-07-25 | Disposition: A | Discharge status: Home / Self Care | Payer: PRIVATE HEALTH INSURANCE | Attending: Emergency Medicine | Admitting: Emergency Medicine

## 2014-07-25 ENCOUNTER — Encounter (HOSPITAL_COMMUNITY): Payer: Self-pay

## 2014-07-25 ENCOUNTER — Emergency Department (HOSPITAL_COMMUNITY): Payer: PRIVATE HEALTH INSURANCE

## 2014-07-25 DIAGNOSIS — Z720 Tobacco use: Secondary | ICD-10-CM | POA: Insufficient documentation

## 2014-07-25 DIAGNOSIS — Z792 Long term (current) use of antibiotics: Secondary | ICD-10-CM | POA: Insufficient documentation

## 2014-07-25 DIAGNOSIS — R059 Cough, unspecified: Secondary | ICD-10-CM

## 2014-07-25 DIAGNOSIS — I10 Essential (primary) hypertension: Secondary | ICD-10-CM | POA: Insufficient documentation

## 2014-07-25 DIAGNOSIS — J45909 Unspecified asthma, uncomplicated: Secondary | ICD-10-CM | POA: Insufficient documentation

## 2014-07-25 DIAGNOSIS — J159 Unspecified bacterial pneumonia: Secondary | ICD-10-CM | POA: Insufficient documentation

## 2014-07-25 DIAGNOSIS — J189 Pneumonia, unspecified organism: Secondary | ICD-10-CM

## 2014-07-25 DIAGNOSIS — R05 Cough: Secondary | ICD-10-CM

## 2014-07-25 DIAGNOSIS — R509 Fever, unspecified: Secondary | ICD-10-CM

## 2014-07-25 DIAGNOSIS — Z79899 Other long term (current) drug therapy: Secondary | ICD-10-CM | POA: Insufficient documentation

## 2014-07-25 MED ORDER — AZITHROMYCIN 250 MG PO TABS
500.0000 mg | ORAL_TABLET | Freq: Once | ORAL | Status: AC
Start: 2014-07-25 — End: 2014-07-25
  Administered 2014-07-25: 500 mg via ORAL
  Filled 2014-07-25: qty 2

## 2014-07-25 MED ORDER — AZITHROMYCIN 250 MG PO TABS: 250.0000 mg | ORAL_TABLET | Freq: Every day | ORAL | Status: DC

## 2014-07-25 MED ORDER — IBUPROFEN 400 MG PO TABS
600.0000 mg | ORAL_TABLET | Freq: Once | ORAL | Status: AC
  Administered 2014-07-25: 600 mg via ORAL
  Filled 2014-07-25: qty 2

## 2014-07-25 MED ORDER — AMOXICILLIN 250 MG PO CAPS
1000.0000 mg | ORAL_CAPSULE | Freq: Once | ORAL | Status: AC
  Administered 2014-07-25: 1000 mg via ORAL
  Filled 2014-07-25: qty 4

## 2014-07-25 MED ORDER — AMOXICILLIN 500 MG PO CAPS: 1000.0000 mg | ORAL_CAPSULE | Freq: Three times a day (TID) | ORAL | Status: DC

## 2014-07-25 NOTE — ED Notes (Signed)
Patient states Friday started with cough and congestion, and got worse yesterday. Patient states weakness and fever as well.

## 2014-07-25 NOTE — Discharge Instructions (Signed)
Pneumonia, Adult Pneumonia is an infection of the lungs. It may be caused by a germ (virus or bacteria). Some types of pneumonia can spread easily from person to person. This can happen when you cough or sneeze. HOME CARE  Only take medicine as told by your doctor.  Take your medicine (antibiotics) as told. Finish it even if you start to feel better.  Do not smoke.  You may use a vaporizer or humidifier in your room. This can help loosen thick spit (mucus).  Sleep so you are almost sitting up (semi-upright). This helps reduce coughing.  Rest. A shot (vaccine) can help prevent pneumonia. Shots are often advised for:  People over 37 years old.  Patients on chemotherapy.  People with long-term (chronic) lung problems.  People with immune system problems. GET HELP RIGHT AWAY IF:   You are getting worse.  You cannot control your cough, and you are losing sleep.  You cough up blood.  Your pain gets worse, even with medicine.  You have a fever.  Any of your problems are getting worse, not better.  You have shortness of breath or chest pain. MAKE SURE YOU:   Understand these instructions.  Will watch your condition.  Will get help right away if you are not doing well or get worse. Document Released: 02/13/2008 Document Revised: 11/19/2011 Document Reviewed: 11/17/2010 [facility] Patient Information 2015 [place], [place]. This information is not intended to replace advice given to you by your health care provider. Make sure you discuss any questions you have with your health care provider.   Emergency Department Resource Guide 1) Find a Doctor and Pay Out of Pocket Although you won't have to find out who is covered by your insurance plan, it is a good idea to ask around and get recommendations. You will then need to call the office and see if the doctor you have chosen will accept you as a new patient and what types of options they offer for patients who are self-pay. Some  doctors offer discounts or will set up payment plans for their patients who do not have insurance, but you will need to ask so you aren't surprised when you get to your appointment.  2) Contact Your Local Health Department Not all health departments have doctors that can see patients for sick visits, but many do, so it is worth a call to see if yours does. If you don't know where your local health department is, you can check in your phone book. The CDC also has a tool to help you locate your state's health department, and many state websites also have listings of all of their local health departments.  3) Find a Walk-in Clinic If your illness is not likely to be very severe or complicated, you may want to try a walk in clinic. These are popping up all over the country in pharmacies, drugstores, and shopping centers. They're usually staffed by nurse practitioners or physician assistants that have been trained to treat common illnesses and complaints. They're usually fairly quick and inexpensive. However, if you have serious medical issues or chronic medical problems, these are probably not your best option.  No Primary Care Doctor: - Call Health Connect at  [phone] - they can help you locate a primary care doctor that  accepts your insurance, provides certain services, etc. - Physician Referral Service- [phone]  Chronic Pain Problems: Organization         Address  Phone   Notes  [facility]  [phone] Patients need to be referred by their primary care doctor.   Medication Assistance: Organization         Address  Phone   Notes  [facility] Medication [facility] [address] [place], [place] [address] [phone] --Must be a resident of [facility] -- Must have NO insurance coverage whatsoever (no Medicaid/ Medicare, etc.) -- The pt. MUST have a primary care doctor that directs their care regularly and follows them in the  community   [facility]ist  [phone]   [employer]  [phone]    Agencies that provide inexpensive medical care: Organization         Address  Phone   Notes  [facility]  [phone]   [facility]    [phone]   [facility] [address] [place], [place] [address] [phone]   [facility] [address] [place], [place] [phone]   Planned Parenthood    [phone]   [facility]    [phone]   [facility]  [address], [place] Phone:  [phone], Fax:  [phone] Hours of Operation:  9 am - 6 pm, M-F.  Also accepts Medicaid/Medicare and self-pay.  [facility]  [address], [place] Phone: [phone], Fax: [phone]. Hours of Operation:  8:30 am - 5:30 pm, M-F.  Also accepts Medicaid and self-pay.  [facility] [place] [address], [place] Phone: [phone]   [facility] [address] Natasha Bence [place], [place] [phone], Ext. [phone] Mondays & Thursdays: 7-9 AM.  First 15 patients are seen on a first come, first serve basis.    Medicaid-accepting [facility] Providers:  Organization         Address  Phone   Notes  [facility] [address], [place] [phone] Also accepts self-pay patients.  [facility] [address] Laurell Josephs [place], [place]  [phone]   [facility] [address], [place] [phone]   [facility] [address], [place] [phone]   Renaye Rakers [address], [place]   [phone] Only accepts [place] patients after they have their name applied to their card.   Self-Pay (no insurance) in [facility]:  Organization         Address  Phone   Notes  Sickle Cell Patients, [facility]  Internal Medicine [address] [place], [place] [phone]   [facility] [address] [place], [place] [phone]   [facility] [place]  [address], [place] [phone]   [facility]/Dr. Osei-Bonsu  [address], [place] or [address], [place] [phone] Phone number for both [place] and [place] locations is the same.  [facility] [address], [place] [phone]   [facility] [address], [place] or [address] [phone]   [facility] [address], [place] [phone], phone; [phone], fax Sees patients 1st and 3rd Saturday of every month.  Must not qualify for public or private insurance (i.e. Medicaid, Medicare, [place] Health  Choice, Veterans' Benefits)  Household income should be no more than 200% of the poverty level The clinic cannot treat you if you are pregnant or think you are pregnant  Sexually transmitted diseases are not treated at the clinic.    Dental Care: Organization         Address  Phone  Notes  [facility] Department of [facility] [address] [place], [place] [phone] Accepts children up to age 27 who are enrolled in [place] or [place] Health Choice; pregnant women with a Medicaid card; and children who have applied for Medicaid or [place] Health Choice, but were declined, whose parents can pay a reduced fee at time of service.  [facility] Department of [facility]  [address], [place] [phone] Accepts children up to age 90 who are enrolled in [place] or [place] Health Choice; pregnant women with a Medicaid card; and children who have applied for Medicaid or [place] Health Choice, but were declined, whose parents can pay a reduced fee at time of service.  [facility]  [address] [place], [place] [phone] Patients are seen by appointment only. Walk-ins are not accepted. [facility] will see patients 52 years of age and older. Monday - Tuesday (8am-5pm) Most Wednesdays (8:30-5pm) $30 per visit, cash only  [facility] Adult Dental Access PRO[facility]  [address], [facility] [phone] Patients are seen by appointment only. Walk-ins are not accepted. [facility] will see patients 68 years of age and older. One Wednesday Evening (Monthly: Volunteer Based).  $30 per visit, cash only  [employer]  [phone] for adults; Children under age 81, call Graduate Pediatric Dentistry at [phone]. Children aged 21-14, please call [phone] to request a pediatric application.  Dental services are provided in all areas of dental care including fillings, crowns and bridges, complete and partial dentures, implants, gum treatment, root canals, and extractions. Preventive care is also provided. Treatment is provided to both adults and children. Patients are selected via a lottery and there is often a waiting list.   [facility] [address] [place]  [phone] [email or web address]   [facility] [address] [place], [place] [phone], Ext. [phone] Second and Fourth Thursday of each month, opens at 6:30 AM; Clinic ends at 9 AM.  Patients are seen on a first-come first-served basis, and a limited number are seen during each clinic.   [facility]  [address] Ether [place], [place] [phone]   Eligibility Requirements You must have lived in [place], [place], or [place] counties for at least the last three months.   You cannot be eligible for state or federal sponsored [place], including [employer], [place], or [employer].   You generally cannot be eligible for healthcare insurance through your employer.    How to apply: Eligibility screenings are held every  Tuesday and Wednesday afternoon from 1:00 pm until 4:00 pm. You do not need an appointment for the interview!  [facility] [address] [place], [place] [phone]   [facility] Health Department  [phone]   [facility] Health Department  [phone]   [facility] Health Department  [phone]    Behavioral Health Resources in the Community: Intensive Outpatient Programs Organization         Address  Phone  Notes  [facility] Services [address], [place], [place] [phone]   [facility] Outpatient [address], [place], [place] [phone]   [facility] [address], [place], [place]  [phone]   [facility] [address],  [place], [place] [phone] or [phone]   Substance Abuse Resources Organization         Address  Phone  Notes  [facility]  [phone]   [facility]  [phone]   [facility]  [phone]   [facility]  [phone]   Residential & Outpatient Substance Abuse Program  [phone]   Psychological Services Organization         Address  Phone  Notes  [facility]  [phone]   [facility]  [phone]   [facility] [address], [place] [phone] or [phone]    Mobile Crisis Teams Organization         Address  Phone  Notes  [facility], Mobile Crisis Care Unit  [phone]   [facility]  [address] [place], [place] [phone]   Doristine LocksSharon DeEsch [address] [place] [phone]    Self-Help/Support Groups Organization         Address  Phone             Notes  [facility] [place] - variety of support groups  336- [phone] Call for more information  Narcotics Anonymous (NA), [facility] [address], [place]  2 meetings at this location    Statisticianesidential Treatment Programs Organization         Address  Phone  Notes  [facility] [address],    [place]  [phone]   [facility]  [address], [place] [phone], [place], [place] [phone]   [facility] [address], [place] [phone] Admissions: 8am-3pm M-F  [facility] [address]    [place], [place] [phone]   [facility] [address], [place], [place] [phone]   [facility] [address]  [place], [place] [phone]   [facility] - Intensive Outpatient [address], [place], [place] [phone]   [facility] ([facility] [address]  [place], [place] [phone] or [phone]   [facility] ([facility]) [address] [place], [place] [phone] Accepts Medicaid  [facility] [address]  [place] [phone] Substance Abuse/Addiction Treatment   [facility] Resources Organization         Address  Phone  Notes  [facility]  [phone]) [phone]   Angie FavaJulie Brannon, PhD [address], [address] [place], [place]   [phone](336) [phone] or [phone](336) [phone]   [facility]   [address] [place], [place] [phone](336) [phone]   [facility] [address], [place], [place] [phone] Insurance/Medicaid/sponsorship through [facility]  [facility] [address]                                    [place], [place] [phone] Therapy/tele-psych/case  [facility] [address]   [place], [place] [phone](336) [phone]    Dr. Lolly MustacheArfeen  [phone]   [facility]  [employer] [facility] 1) [address], [address] [place]  2) [address], [place] 3)  [address], [place] [phone]  [phone]   [facility] Child Abuse Hotline [phone] or [phone] (After  Hours)

## 2014-07-29 NOTE — ED Provider Notes (Signed)
CSN: [id number]     Arrival date & time 07/25/14  1912 History   First MD Initiated Contact with Patient 07/25/14 1951     Chief Complaint  Patient presents with  . Cough     (Consider location/radiation/quality/duration/timing/severity/associated sxs/prior Treatment) HPI   38 year old male with severe fatigue. Symptom onset several days ago and progressively worsening. Initially started out with cough and some facial congestion. Is cough as become progressive. Perhaps some mild shortness of breath. Subjective fever. Diffuse body aches. No rash. No nausea or vomiting but does endorse anorexia. No urinary complaints. No diarrhea. No unusual leg pain or swelling.  Past Medical History  Diagnosis Date  . Asthma   . Hypertension    History reviewed. No pertinent past surgical history. History reviewed. No pertinent family history. History  Substance Use Topics  . Smoking status: Current Every Day Smoker -- 0.50 packs/day    Types: Cigarettes  . Smokeless tobacco: Not on file  . Alcohol Use: Yes     Comment: Rarely    Review of Systems  All systems reviewed and negative, other than as noted in HPI.   Allergies  Lisinopril  Home Medications   Prior to Admission medications   Medication Sig Start Date End Date Taking? Authorizing Provider  amLODipine (NORVASC) 10 MG tablet Take 1 tablet (10 mg total) by mouth daily. 08/03/13  Yes Flint MelterElliott L Wentz, MD  diphenhydrAMINE (BENADRYL) 25 MG tablet Take 25 mg by mouth every 6 (six) hours as needed for itching or allergies.   Yes Historical Provider, MD  furosemide (LASIX) 20 MG tablet Take 1 tablet (20 mg total) by mouth daily. 08/03/13  Yes Flint MelterElliott L Wentz, MD  ibuprofen (ADVIL,MOTRIN) 200 MG tablet Take 400 mg by mouth daily as needed for headache.   Yes Historical Provider, MD  Phenyleph-CPM-DM-Aspirin (ALKA-SELTZER PLUS COLD & COUGH) 7.04-11-09-325 MG TBEF Take 1-2 tablets by mouth daily as needed (for cold sympttoms).   Yes  Historical Provider, MD  amLODipine (NORVASC) 10 MG tablet Take 1 tablet (10 mg total) by mouth daily. Patient not taking: Reported on 07/25/2014 05/03/14   Vanetta MuldersScott Zackowski, MD  amoxicillin (AMOXIL) 500 MG capsule Take 2 capsules (1,000 mg total) by mouth 3 (three) times daily. 07/25/14   Raeford RazorStephen Marshall Kampf, MD  azithromycin (ZITHROMAX) 250 MG tablet Take 1 tablet (250 mg total) by mouth daily. 07/25/14   Raeford RazorStephen Nickole Adamek, MD  furosemide (LASIX) 20 MG tablet Take 1 tablet (20 mg total) by mouth daily. Patient not taking: Reported on 07/25/2014 05/03/14   Vanetta MuldersScott Zackowski, MD   BP 135/109 mmHg  Pulse 102  Temp(Src) 100.6 F (38.1 C) (Oral)  Resp 20  Ht 6' (1.829 m)  Wt 230 lb (104.327 kg)  BMI 31.19 kg/m2  SpO2 99% Physical Exam  Constitutional: He appears well-developed and well-nourished. No distress.  Laying in bed. Appears tired, but nontoxic.  HENT:  Head: Normocephalic and atraumatic.  Eyes: Conjunctivae are normal. Right eye exhibits no discharge. Left eye exhibits no discharge.  Neck: Neck supple.  Cardiovascular: Normal rate, regular rhythm and normal heart sounds.  Exam reveals no gallop and no friction rub.   No murmur heard. Pulmonary/Chest: Effort normal and breath sounds normal. No respiratory distress.  Abdominal: Soft. He exhibits no distension. There is no tenderness.  Musculoskeletal: He exhibits no edema or tenderness.  Lower extremities symmetric as compared to each other. No calf tenderness. Negative Homan's. No palpable cords.   Neurological: He is alert.  Skin: Skin is  warm and dry.  Psychiatric: He has a normal mood and affect. His behavior is normal. Thought content normal.  Nursing note and vitals reviewed.   ED Course  Procedures (including critical care time) Labs Review Labs Reviewed - No data to display  Imaging Review No results found.   Dg Chest 2 View  07/25/2014   CLINICAL DATA:  Cough and congestion for 3 days  EXAM: CHEST  2 VIEW  COMPARISON:   05/03/2014  FINDINGS: Cardiac shadow is stable. The lungs are well aerated bilaterally and show some very mild infiltrative changes in the left lung projecting in the left lower lobe.  IMPRESSION: Early patchy left lower lobe infiltrate.   Electronically Signed   By: Alcide CleverMark  Lukens M.D.   On: 07/25/2014 20:42    EKG Interpretation None      MDM   Final diagnoses:  CAP (community acquired pneumonia)    38 year old male with fever, body aches, cough and congestion. X-ray with infiltrate. We'll treat for community-acquired pneumonia. He has no significantly increased work of breathing. His oxygen saturations were normal. I feel he is appropriate for outpatient treatment. Return precautions were discussed.    Raeford RazorStephen Airen Dales, MD 07/29/14 [phone]

## 2014-08-10 ENCOUNTER — Emergency Department (HOSPITAL_COMMUNITY)
Admission: EM | Admit: 2014-08-10 | Discharge: 2014-08-10 | Disposition: A | Discharge status: Home / Self Care | Payer: PRIVATE HEALTH INSURANCE | Attending: Emergency Medicine | Admitting: Emergency Medicine

## 2014-08-10 ENCOUNTER — Encounter (HOSPITAL_COMMUNITY): Payer: Self-pay | Admitting: Emergency Medicine

## 2014-08-10 ENCOUNTER — Emergency Department (HOSPITAL_COMMUNITY): Payer: PRIVATE HEALTH INSURANCE

## 2014-08-10 DIAGNOSIS — J189 Pneumonia, unspecified organism: Secondary | ICD-10-CM

## 2014-08-10 DIAGNOSIS — I10 Essential (primary) hypertension: Secondary | ICD-10-CM | POA: Insufficient documentation

## 2014-08-10 DIAGNOSIS — Z720 Tobacco use: Secondary | ICD-10-CM | POA: Insufficient documentation

## 2014-08-10 DIAGNOSIS — J159 Unspecified bacterial pneumonia: Secondary | ICD-10-CM | POA: Insufficient documentation

## 2014-08-10 DIAGNOSIS — R059 Cough, unspecified: Secondary | ICD-10-CM

## 2014-08-10 DIAGNOSIS — J45909 Unspecified asthma, uncomplicated: Secondary | ICD-10-CM | POA: Insufficient documentation

## 2014-08-10 DIAGNOSIS — R05 Cough: Secondary | ICD-10-CM

## 2014-08-10 DIAGNOSIS — Z792 Long term (current) use of antibiotics: Secondary | ICD-10-CM | POA: Insufficient documentation

## 2014-08-10 DIAGNOSIS — Z79899 Other long term (current) drug therapy: Secondary | ICD-10-CM | POA: Insufficient documentation

## 2014-08-10 MED ORDER — DOXYCYCLINE HYCLATE 100 MG PO CAPS: 100.0000 mg | ORAL_CAPSULE | Freq: Two times a day (BID) | ORAL | Status: DC

## 2014-08-10 NOTE — ED Notes (Signed)
Pt reports was seen for same recently. Pt reports was told to follow-up after finished abx. Pt reports finished abx yesterday and reports continued cough and difficulty breathing. Pt was diagnosed with pneumonia at last visit. nad noted. Mild dyspnea noted in triage.

## 2014-08-10 NOTE — ED Provider Notes (Signed)
CSN: [id number]     Arrival date & time 08/10/14  1009 History   First MD Initiated Contact with Patient 08/10/14 1024     Chief Complaint  Patient presents with  . Cough     (Consider location/radiation/quality/duration/timing/severity/associated sxs/prior Treatment) HPI Comments: Pt comes in today with complaints of recurrent cough and sob. Pt states that he was diagnosed with pneumonia last week. He finished the zithromax 2 days ago and yesterday the cough and sob started again. Denies recurrent fever. History of htn  The history is provided by the patient. No language interpreter was used.    Past Medical History  Diagnosis Date  . Asthma   . Hypertension    History reviewed. No pertinent past surgical history. History reviewed. No pertinent family history. History  Substance Use Topics  . Smoking status: Current Every Day Smoker -- 0.25 packs/day    Types: Cigarettes  . Smokeless tobacco: Not on file  . Alcohol Use: Yes     Comment: Rarely    Review of Systems  All other systems reviewed and are negative.     Allergies  Lisinopril  Home Medications   Prior to Admission medications   Medication Sig Start Date End Date Taking? Authorizing Provider  Dextromethorphan Polistirex (DELSYM PO) Take 10 mLs by mouth 2 (two) times daily as needed (cough).   Yes Historical Provider, MD  Phenyleph-CPM-DM-Aspirin (ALKA-SELTZER PLUS COLD & COUGH) 7.04-11-09-325 MG TBEF Take 1-2 tablets by mouth daily as needed (for cold sympttoms).   Yes Historical Provider, MD  amLODipine (NORVASC) 10 MG tablet Take 1 tablet (10 mg total) by mouth daily. 08/03/13   Flint MelterElliott L Wentz, MD  diphenhydrAMINE (BENADRYL) 25 MG tablet Take 25 mg by mouth every 6 (six) hours as needed for itching or allergies.    Historical Provider, MD  doxycycline (VIBRAMYCIN) 100 MG capsule Take 1 capsule (100 mg total) by mouth 2 (two) times daily. 08/10/14   Teressa LowerVrinda Tuwanda Vokes, NP  furosemide (LASIX) 20 MG tablet Take 1  tablet (20 mg total) by mouth daily. 08/03/13   Flint MelterElliott L Wentz, MD  ibuprofen (ADVIL,MOTRIN) 200 MG tablet Take 400 mg by mouth daily as needed for headache.    Historical Provider, MD   BP 145/101 mmHg  Pulse 99  Temp(Src) 98.2 F (36.8 C) (Oral)  Resp 20  Ht 6' (1.829 m)  Wt 230 lb (104.327 kg)  BMI 31.19 kg/m2  SpO2 99% Physical Exam  Constitutional: He is oriented to person, place, and time. He appears well-developed and well-nourished.  HENT:  Right Ear: External ear normal.  Left Ear: External ear normal.  Mouth/Throat: Posterior oropharyngeal erythema present.  Cardiovascular: Normal rate and regular rhythm.   Pulmonary/Chest: Effort normal and breath sounds normal.  Musculoskeletal: Normal range of motion.  Neurological: He is alert and oriented to person, place, and time.  Skin: Skin is warm.  Nursing note and vitals reviewed.   ED Course  Procedures (including critical care time) Labs Review Labs Reviewed - No data to display  Imaging Review Dg Chest 2 View  08/10/2014   CLINICAL DATA:  Persistent cough. Recent pneumonia. Shortness of breath. Hypertension.  EXAM: CHEST  2 VIEW  COMPARISON:  07/25/2014  FINDINGS: There is borderline cardiomegaly. The patchy infiltrate in the left lung has almost cleared. There is slight accentuation of the interstitial markings at both lung bases laterally. No effusions. Pulmonary vascularity is normal. Slight upper thoracic scoliosis, stable.  IMPRESSION: Almost complete clearing of the infiltrate in  the left lung. New slight bibasilar atelectasis. New slight cardiomegaly.   Electronically Signed   By: Geanie CooleyJim  Maxwell M.D.   On: 08/10/2014 11:09     EKG Interpretation None      MDM   Final diagnoses:  Cough  Community acquired pneumonia    Vital stable to treat with doxy as pneumonia hasn't resolved completely. Discussed recheck    Teressa LowerVrinda Aniesha Haughn, NP 08/10/14 1512  Benny LennertJoseph L Zammit, MD 08/10/14 [id number]

## 2014-08-23 ENCOUNTER — Emergency Department (HOSPITAL_COMMUNITY)
Admission: EM | Admit: 2014-08-23 | Discharge: 2014-08-23 | Disposition: A | Discharge status: Home / Self Care | Payer: PRIVATE HEALTH INSURANCE | Attending: Emergency Medicine | Admitting: Emergency Medicine

## 2014-08-23 ENCOUNTER — Encounter (HOSPITAL_COMMUNITY): Payer: Self-pay | Admitting: Emergency Medicine

## 2014-08-23 ENCOUNTER — Emergency Department (HOSPITAL_COMMUNITY): Payer: PRIVATE HEALTH INSURANCE

## 2014-08-23 DIAGNOSIS — I10 Essential (primary) hypertension: Secondary | ICD-10-CM | POA: Insufficient documentation

## 2014-08-23 DIAGNOSIS — Z792 Long term (current) use of antibiotics: Secondary | ICD-10-CM | POA: Insufficient documentation

## 2014-08-23 DIAGNOSIS — J189 Pneumonia, unspecified organism: Secondary | ICD-10-CM

## 2014-08-23 DIAGNOSIS — R059 Cough, unspecified: Secondary | ICD-10-CM

## 2014-08-23 DIAGNOSIS — J159 Unspecified bacterial pneumonia: Secondary | ICD-10-CM | POA: Insufficient documentation

## 2014-08-23 DIAGNOSIS — Z720 Tobacco use: Secondary | ICD-10-CM | POA: Insufficient documentation

## 2014-08-23 DIAGNOSIS — Z79899 Other long term (current) drug therapy: Secondary | ICD-10-CM | POA: Insufficient documentation

## 2014-08-23 DIAGNOSIS — I509 Heart failure, unspecified: Secondary | ICD-10-CM

## 2014-08-23 DIAGNOSIS — J45901 Unspecified asthma with (acute) exacerbation: Secondary | ICD-10-CM | POA: Insufficient documentation

## 2014-08-23 DIAGNOSIS — R05 Cough: Secondary | ICD-10-CM

## 2014-08-23 MED ORDER — IPRATROPIUM-ALBUTEROL 0.5-2.5 (3) MG/3ML IN SOLN
3.0000 mL | Freq: Once | RESPIRATORY_TRACT | Status: AC
  Administered 2014-08-23: 3 mL via RESPIRATORY_TRACT
  Filled 2014-08-23: qty 3

## 2014-08-23 MED ORDER — AZITHROMYCIN 250 MG PO TABS: ORAL_TABLET | ORAL | Status: AC

## 2014-08-23 MED ORDER — ALBUTEROL SULFATE (2.5 MG/3ML) 0.083% IN NEBU
2.5000 mg | INHALATION_SOLUTION | Freq: Once | RESPIRATORY_TRACT | Status: AC
  Administered 2014-08-23: 2.5 mg via RESPIRATORY_TRACT
  Filled 2014-08-23: qty 3

## 2014-08-23 MED ORDER — AMLODIPINE BESYLATE 10 MG PO TABS: 10.0000 mg | ORAL_TABLET | Freq: Every day | ORAL | Status: AC

## 2014-08-23 MED ORDER — AZITHROMYCIN 250 MG PO TABS
500.0000 mg | ORAL_TABLET | Freq: Once | ORAL | Status: AC
  Administered 2014-08-23: 500 mg via ORAL
  Filled 2014-08-23: qty 2

## 2014-08-23 MED ORDER — FUROSEMIDE 20 MG PO TABS: 20.0000 mg | ORAL_TABLET | Freq: Every day | ORAL | Status: AC

## 2014-08-23 MED ORDER — PREDNISONE 50 MG PO TABS
60.0000 mg | ORAL_TABLET | Freq: Once | ORAL | Status: AC
  Administered 2014-08-23: 60 mg via ORAL
  Filled 2014-08-23 (×2): qty 1

## 2014-08-23 NOTE — ED Provider Notes (Signed)
CSN: [id number]     Arrival date & time 08/23/14  1143 History  This chart was scribed for Joya Gaskinsonald W Merlin Ege, MD by Ronney LionSuzanne Le, ED Scribe. This patient was seen in room APA12/APA12 and the patient's care was started at 3:40 PM.    Chief Complaint  Patient presents with  . Shortness of Breath   Patient is a 38 y.o. male presenting with cough. The history is provided by the patient. No language interpreter was used.  Cough Cough characteristics:  Productive Severity:  Moderate Duration:  4 weeks Chronicity:  Recurrent Smoker: yes   Context comment:  Recent pneumonia infection Associated symptoms: shortness of breath   Risk factors: recent infection      HPI Comments: Dillon Valdez is a 38 y.o. male with a history of asthma who presents to the Emergency Department complaining of a cough that began about a month ago. Patient had recently been treated for pneumonia. He states that he first noticed blood when coughing yesterday, and complains of associated chest tightness, SOB, and loss of appetite. He has recently been treated by antibiotics for his pneumonia. Patient had previously been on medication for hypertension. He denies vomiting, pedal edema, and weight gain. He also denies a history of heart failure, MI, and DVT/PE. Patient is a smoker but hasn't smoked in 3 weeks. He denies illicit drug use. Patient denies having a PCP.     Past Medical History  Diagnosis Date  . Asthma   . Hypertension    History reviewed. No pertinent past surgical history. History reviewed. No pertinent family history. History  Substance Use Topics  . Smoking status: Current Every Day Smoker -- 0.25 packs/day    Types: Cigarettes  . Smokeless tobacco: Not on file  . Alcohol Use: Yes     Comment: Rarely    Review of Systems  Constitutional: Positive for appetite change. Negative for unexpected weight change.  Respiratory: Positive for cough and shortness of breath.   Cardiovascular: Negative for  leg swelling.  Gastrointestinal: Negative for vomiting.  All other systems reviewed and are negative.     Allergies  Lisinopril  Home Medications   Prior to Admission medications   Medication Sig Start Date End Date Taking? Authorizing Provider  amLODipine (NORVASC) 10 MG tablet Take 1 tablet (10 mg total) by mouth daily. 08/23/14   Joya Gaskinsonald W Carizma Dunsworth, MD  azithromycin [facility](ZITHROMAX) 250 MG tablet One tablet PO daily for 4 days 08/23/14   Joya Gaskinsonald W Ridgely Anastacio, MD  furosemide (LASIX) 20 MG tablet Take 1 tablet (20 mg total) by mouth daily. 08/23/14   Joya Gaskinsonald W Stephannie Broner, MD   BP 144/102 mmHg  Pulse 116  Temp(Src) 98 F (36.7 C) (Oral)  Resp 18  Ht 6' (1.829 m)  Wt 230 lb (104.327 kg)  BMI 31.19 kg/m2  SpO2 98% Physical Exam  Nursing note and vitals reviewed.   CONSTITUTIONAL: Well developed/well nourished HEAD: Normocephalic/atraumatic EYES: EOMI/PERRL ENMT: Mucous membranes moist NECK: supple no meningeal signs. Positive JVD. SPINE/BACK:entire spine nontender CV: S1/S2 noted, no murmurs/rubs/gallops noted LUNGS: decreased breath sounds bilaterally with wheezing noted. ABDOMEN: soft, nontender, no rebound or guarding, bowel sounds noted throughout abdomen GU:no cva tenderness NEURO: Pt is awake/alert/appropriate, moves all extremitiesx4.  No facial droop.   EXTREMITIES: pulses normal/equal, full ROM SKIN: warm, color normal PSYCH: no abnormalities of mood noted, alert and oriented to situation   ED Course  Procedures   DIAGNOSTIC STUDIES: Oxygen Saturation is 98% on room air, normal by my  interpretation.    COORDINATION OF CARE: 3:46 PM - Discussed treatment plan with pt at bedside which includes breathing treatments, steroids, and EKG and pt agreed to plan. I mentioned the possibility of a hospital stay, but patient declined.     Patient refused all labs He did not want to be admitted I am suspicious he has CHF - he has had elevated BNP previously and has  cardiomegaly with recurrent SOB.  He has not been on any meds for HTN Will restart meds he has been on before (lasix/norvasc) He may have some asthmatic component, will advise to continue his albuterol Due to possible pneumonia will also place on azithromycin  I discussed risk of death/disability of leaving against medical advice and the patient accepts these risks.  The patient is awake/alert able to make decisions, and does not appear intoxicated Patient discharged against medical advice.  He was given info for f/u as outpatient We discussed strict return precautions  Medications  ipratropium-albuterol (DUONEB) 0.5-2.5 (3) MG/3ML nebulizer solution 3 mL (3 mLs Nebulization Given 08/23/14 1606)  albuterol (PROVENTIL) (2.5 MG/3ML) 0.083% nebulizer solution 2.5 mg (2.5 mg Nebulization Given 08/23/14 1606)  predniSONE (DELTASONE) tablet 60 mg (60 mg Oral Given 08/23/14 1601)  azithromycin (ZITHROMAX) tablet 500 mg (500 mg Oral Given 08/23/14 1656)     Imaging Review Dg Chest 2 View  08/23/2014   CLINICAL DATA:  Cough and shortness of breath for 1 month since visit in ER when diagnosed with pneumonia, history asthma, hypertension, recently stopped smoking  EXAM: CHEST  2 VIEW  COMPARISON:  08/10/2014  FINDINGS: Enlargement of cardiac silhouette.  Mediastinal contours and pulmonary vascularity normal.  Peribronchial thickening with patchy infiltrates in the mid to lower lungs question pneumonia.  No significant pleural effusion or pneumothorax.  Bones unremarkable.  IMPRESSION: Enlargement of cardiac silhouette.  Bronchitic changes with patchy infiltrates in the mid to lower lungs favoring pneumonia.   Electronically Signed   By: Ulyses SouthwardMark  Boles M.D.   On: 08/23/2014 12:59     EKG Interpretation   Date/Time:  Monday August 23 2014 16:24:41 EST Ventricular Rate:  100 PR Interval:  168 QRS Duration: 104 QT Interval:  399 QTC Calculation: 515 R Axis:   14 Text Interpretation:  Sinus  tachycardia Left atrial enlargement Probable  anteroseptal infarct, old Nonspecific T abnormalities, lateral leads  Prolonged QT interval No significant change since last tracing Confirmed  by Bebe ShaggyWICKLINE  MD, Dorinda HillNALD ([id number]) on 08/23/2014 4:33:37 PM      MDM   Final diagnoses:  CAP (community acquired pneumonia)  Essential hypertension  Asthma attack  Acute congestive heart failure, unspecified congestive heart failure type     Nursing notes including past medical history and social history reviewed and considered in documentation xrays/imaging reviewed by myself and considered during evaluation Previous records reviewed and considered   I personally performed the services described in this documentation, which was scribed in my presence. The recorded information has been reviewed and is accurate.    Joya Gaskinsonald W Linsi Humann, MD 08/23/14 [phone]

## 2014-08-23 NOTE — ED Notes (Signed)
Patient given discharge instruction, verbalized understand. Patient ambulatory out of the department.  

## 2014-08-23 NOTE — ED Notes (Signed)
Pt reports continuing SOB since Nov. Pt states it is much worse at night. Pt reports blood in his sputum last night.

## 2014-08-23 NOTE — ED Notes (Signed)
MD at the bedside  

## 2014-11-17 ENCOUNTER — Emergency Department (HOSPITAL_COMMUNITY): Payer: Medicaid Other

## 2014-11-17 ENCOUNTER — Inpatient Hospital Stay (HOSPITAL_COMMUNITY)
Admission: EM | Admit: 2014-11-17 | Discharge: 2014-12-10 | DRG: 314 | Disposition: E | Payer: Medicaid Other | Attending: Cardiology | Admitting: Cardiology

## 2014-11-17 ENCOUNTER — Encounter (HOSPITAL_COMMUNITY): Payer: Self-pay

## 2014-11-17 DIAGNOSIS — Z01818 Encounter for other preprocedural examination: Secondary | ICD-10-CM | POA: Insufficient documentation

## 2014-11-17 DIAGNOSIS — F17210 Nicotine dependence, cigarettes, uncomplicated: Secondary | ICD-10-CM | POA: Diagnosis present

## 2014-11-17 DIAGNOSIS — D72829 Elevated white blood cell count, unspecified: Secondary | ICD-10-CM

## 2014-11-17 DIAGNOSIS — E873 Alkalosis: Secondary | ICD-10-CM | POA: Diagnosis present

## 2014-11-17 DIAGNOSIS — G92 Toxic encephalopathy: Secondary | ICD-10-CM | POA: Diagnosis present

## 2014-11-17 DIAGNOSIS — R0602 Shortness of breath: Secondary | ICD-10-CM

## 2014-11-17 DIAGNOSIS — I409 Acute myocarditis, unspecified: Principal | ICD-10-CM | POA: Diagnosis present

## 2014-11-17 DIAGNOSIS — F419 Anxiety disorder, unspecified: Secondary | ICD-10-CM | POA: Diagnosis present

## 2014-11-17 DIAGNOSIS — Z79899 Other long term (current) drug therapy: Secondary | ICD-10-CM

## 2014-11-17 DIAGNOSIS — I469 Cardiac arrest, cause unspecified: Secondary | ICD-10-CM | POA: Diagnosis not present

## 2014-11-17 DIAGNOSIS — I214 Non-ST elevation (NSTEMI) myocardial infarction: Secondary | ICD-10-CM

## 2014-11-17 DIAGNOSIS — I4901 Ventricular fibrillation: Secondary | ICD-10-CM | POA: Diagnosis not present

## 2014-11-17 DIAGNOSIS — J45909 Unspecified asthma, uncomplicated: Secondary | ICD-10-CM | POA: Diagnosis present

## 2014-11-17 DIAGNOSIS — I472 Ventricular tachycardia: Secondary | ICD-10-CM | POA: Diagnosis not present

## 2014-11-17 DIAGNOSIS — I2699 Other pulmonary embolism without acute cor pulmonale: Secondary | ICD-10-CM

## 2014-11-17 DIAGNOSIS — I10 Essential (primary) hypertension: Secondary | ICD-10-CM | POA: Diagnosis present

## 2014-11-17 DIAGNOSIS — I509 Heart failure, unspecified: Secondary | ICD-10-CM | POA: Diagnosis present

## 2014-11-17 DIAGNOSIS — Z888 Allergy status to other drugs, medicaments and biological substances status: Secondary | ICD-10-CM

## 2014-11-17 DIAGNOSIS — J80 Acute respiratory distress syndrome: Secondary | ICD-10-CM | POA: Diagnosis not present

## 2014-11-17 DIAGNOSIS — J189 Pneumonia, unspecified organism: Secondary | ICD-10-CM | POA: Diagnosis present

## 2014-11-17 DIAGNOSIS — J9601 Acute respiratory failure with hypoxia: Secondary | ICD-10-CM | POA: Diagnosis not present

## 2014-11-17 LAB — URINALYSIS, ROUTINE W REFLEX MICROSCOPIC
Bilirubin Urine: NEGATIVE
Glucose, UA: NEGATIVE mg/dL
Hgb urine dipstick: NEGATIVE
KETONES UR: NEGATIVE mg/dL
LEUKOCYTES UA: NEGATIVE
Nitrite: NEGATIVE
PROTEIN: NEGATIVE mg/dL
Specific Gravity, Urine: 1.015 (ref 1.005–1.030)
Urobilinogen, UA: 0.2 mg/dL (ref 0.0–1.0)
pH: 5.5 (ref 5.0–8.0)

## 2014-11-17 LAB — COMPREHENSIVE METABOLIC PANEL
ALT: 32 U/L (ref 0–53)
AST: 27 U/L (ref 0–37)
Albumin: 3.4 g/dL — ABNORMAL LOW (ref 3.5–5.2)
Alkaline Phosphatase: 51 U/L (ref 39–117)
Anion gap: 7 (ref 5–15)
BILIRUBIN TOTAL: 1.8 mg/dL — AB (ref 0.3–1.2)
BUN: 17 mg/dL (ref 6–23)
CALCIUM: 8.2 mg/dL — AB (ref 8.4–10.5)
CO2: 24 mmol/L (ref 19–32)
Chloride: 104 mmol/L (ref 96–112)
Creatinine, Ser: 1.08 mg/dL (ref 0.50–1.35)
GFR calc Af Amer: >90 mL/min (ref >90)
GFR, EST NON AFRICAN AMERICAN: 86 mL/min — AB (ref >90)
Glucose, Bld: 95 mg/dL (ref 70–99)
POTASSIUM: 3.5 mmol/L (ref 3.5–5.1)
Sodium: 135 mmol/L (ref 135–145)
TOTAL PROTEIN: 6.8 g/dL (ref 6.0–8.3)

## 2014-11-17 LAB — CBC WITH DIFFERENTIAL/PLATELET
Basophils Absolute: 0 10*3/uL (ref 0.0–0.1)
Basophils Relative: 0 % (ref 0–1)
Eosinophils Absolute: 0 10*3/uL (ref 0.0–0.7)
Eosinophils Relative: 0 % (ref 0–5)
HCT: 35.7 % — ABNORMAL LOW (ref 39.0–52.0)
Hemoglobin: 11.5 g/dL — ABNORMAL LOW (ref 13.0–17.0)
LYMPHS ABS: 0.8 10*3/uL (ref 0.7–4.0)
Lymphocytes Relative: 6 % — ABNORMAL LOW (ref 12–46)
MCH: 22.4 pg — AB (ref 26.0–34.0)
MCHC: 32.2 g/dL (ref 30.0–36.0)
MCV: 69.5 fL — ABNORMAL LOW (ref 78.0–100.0)
MONOS PCT: 4 % (ref 3–12)
Monocytes Absolute: 0.5 10*3/uL (ref 0.1–1.0)
NEUTROS ABS: 11.8 10*3/uL — AB (ref 1.7–7.7)
Neutrophils Relative %: 90 % — ABNORMAL HIGH (ref 43–77)
Platelets: 227 10*3/uL (ref 150–400)
RBC: 5.14 MIL/uL (ref 4.22–5.81)
RDW: 17.3 % — ABNORMAL HIGH (ref 11.5–15.5)
Smear Review: ADEQUATE
WBC: 13 10*3/uL — ABNORMAL HIGH (ref 4.0–10.5)

## 2014-11-17 LAB — BRAIN NATRIURETIC PEPTIDE: B Natriuretic Peptide: 985 pg/mL — ABNORMAL HIGH (ref 0.0–100.0)

## 2014-11-17 LAB — I-STAT CG4 LACTIC ACID, ED: LACTIC ACID, VENOUS: 1.91 mmol/L (ref 0.5–2.0)

## 2014-11-17 LAB — TROPONIN I: TROPONIN I: 0.41 ng/mL — AB (ref <0.031)

## 2014-11-17 MED ORDER — ACETAMINOPHEN 325 MG PO TABS: 650.0000 mg | ORAL_TABLET | ORAL | Status: DC | PRN

## 2014-11-17 MED ORDER — SODIUM CHLORIDE 0.9 % IV BOLUS (SEPSIS)
1000.0000 mL | Freq: Once | INTRAVENOUS | Status: AC
  Administered 2014-11-17: 1000 mL via INTRAVENOUS

## 2014-11-17 MED ORDER — PIPERACILLIN-TAZOBACTAM 3.375 G IVPB
3.3750 g | Freq: Three times a day (TID) | INTRAVENOUS | Status: DC
  Administered 2014-11-18: 3.375 g via INTRAVENOUS
  Filled 2014-11-17 (×4): qty 50

## 2014-11-17 MED ORDER — VANCOMYCIN HCL IN DEXTROSE 1-5 GM/200ML-% IV SOLN
1000.0000 mg | Freq: Three times a day (TID) | INTRAVENOUS | Status: DC
  Filled 2014-11-17 (×2): qty 200

## 2014-11-17 MED ORDER — AZITHROMYCIN 250 MG PO TABS
500.0000 mg | ORAL_TABLET | Freq: Once | ORAL | Status: AC
  Administered 2014-11-17: 500 mg via ORAL
  Filled 2014-11-17: qty 2

## 2014-11-17 MED ORDER — IBUPROFEN 800 MG PO TABS
800.0000 mg | ORAL_TABLET | Freq: Once | ORAL | Status: AC
  Administered 2014-11-17: 800 mg via ORAL
  Filled 2014-11-17: qty 1

## 2014-11-17 MED ORDER — SODIUM CHLORIDE 0.9 % IV BOLUS (SEPSIS)
250.0000 mL | Freq: Once | INTRAVENOUS | Status: AC
  Administered 2014-11-17: 250 mL via INTRAVENOUS

## 2014-11-17 MED ORDER — HEPARIN SODIUM (PORCINE) 5000 UNIT/ML IJ SOLN: 5000.0000 [IU] | Freq: Three times a day (TID) | INTRAMUSCULAR | Status: DC

## 2014-11-17 MED ORDER — SODIUM CHLORIDE 0.9 % IJ SOLN
3.0000 mL | Freq: Two times a day (BID) | INTRAMUSCULAR | Status: DC
  Administered 2014-11-18: 3 mL via INTRAVENOUS

## 2014-11-17 MED ORDER — SODIUM CHLORIDE 0.9 % IJ SOLN: 3.0000 mL | INTRAMUSCULAR | Status: DC | PRN

## 2014-11-17 MED ORDER — VANCOMYCIN HCL 10 G IV SOLR
2000.0000 mg | Freq: Once | INTRAVENOUS | Status: AC
  Administered 2014-11-18: 2000 mg via INTRAVENOUS
  Filled 2014-11-17: qty 2000

## 2014-11-17 MED ORDER — FUROSEMIDE 10 MG/ML IJ SOLN
40.0000 mg | INTRAMUSCULAR | Status: AC
  Administered 2014-11-17: 40 mg via INTRAVENOUS
  Filled 2014-11-17: qty 4

## 2014-11-17 MED ORDER — ALBUTEROL SULFATE (2.5 MG/3ML) 0.083% IN NEBU
2.5000 mg | INHALATION_SOLUTION | Freq: Four times a day (QID) | RESPIRATORY_TRACT | Status: DC | PRN
  Administered 2014-11-18: 2.5 mg via RESPIRATORY_TRACT
  Filled 2014-11-17: qty 3

## 2014-11-17 MED ORDER — ASPIRIN EC 81 MG PO TBEC: 81.0000 mg | DELAYED_RELEASE_TABLET | Freq: Every day | ORAL | Status: DC

## 2014-11-17 MED ORDER — ONDANSETRON HCL 4 MG/2ML IJ SOLN: 4.0000 mg | Freq: Three times a day (TID) | INTRAMUSCULAR | Status: DC | PRN

## 2014-11-17 MED ORDER — ONDANSETRON HCL 4 MG/2ML IJ SOLN: 4.0000 mg | Freq: Four times a day (QID) | INTRAMUSCULAR | Status: DC | PRN

## 2014-11-17 MED ORDER — DEXTROSE 5 % IV SOLN
1.0000 g | Freq: Once | INTRAVENOUS | Status: AC
  Administered 2014-11-17: 1 g via INTRAVENOUS
  Filled 2014-11-17: qty 10

## 2014-11-17 MED ORDER — SODIUM CHLORIDE 0.9 % IV SOLN: 250.0000 mL | INTRAVENOUS | Status: DC | PRN

## 2014-11-17 MED ORDER — FUROSEMIDE 10 MG/ML IJ SOLN
40.0000 mg | Freq: Two times a day (BID) | INTRAMUSCULAR | Status: DC
  Filled 2014-11-17: qty 4

## 2014-11-17 NOTE — H&P (Addendum)
Dillon Valdez is an 39 y.o. male.    Chief Complaint: shortness of breath, edema - legs, fever Primary Cardiologist: new HPI: [name] is a 39 yo man with PMH of asthma and hypertension who was treated for a upper respiratory illness ~ 2 weeks ago with a z pack and prednisone who has continued to have worsening shortness of breath and now lower extremity edema over the last several days leading to presentation at [facility]. He tells me he actually has had a cough and not felt well since at least Halloween.  He also has a fever to 102.5 but no tactile fever. He also has been using his inhaler much more over the last several weeks. He has some associated nonspecific back pain and chest discomfort - more achy symptoms. No current chest pain on my examination. At [facility] he had blood cultures obtained, antibiotics started and mildly elevated troponin and chest x-ray with cardiomegaly and vascular fullness made clinical picture concerning for a viral myocarditis leading to transfer to [facility]. He also received 40 mg IV lasix x1. On Arrival here, he was quite tachypneic. He says he felt better until transfer and now has been more tachypneic and he feels like he cannot catch his breath. He was able to work today but was told to see a physician.   Past Medical History  Diagnosis Date  . Asthma   . Hypertension     History reviewed. No pertinent past surgical history.  History reviewed. No pertinent family history. Social History:  reports that he has been smoking Cigarettes.  He has been smoking about 0.25 packs per day. He does not have any smokeless tobacco history on file. He reports that he drinks alcohol. He reports that he does not use illicit drugs.Works in [employer] 7am-3pm 7 days a week  Allergies:  Allergies  Allergen Reactions  . Lisinopril Swelling     (Not in a hospital admission)  Results for orders placed or performed during the hospital encounter of 12/01/2014  (from the past 48 hour(s))  CBC with Differential/Platelet     Status: Abnormal   Collection Time: 11/11/2014  7:25 PM  Result Value Ref Range   WBC 13.0 (H) 4.0 - 10.5 K/uL   RBC 5.14 4.22 - 5.81 MIL/uL   Hemoglobin 11.5 (L) 13.0 - 17.0 g/dL   HCT 35.7 (L) 39.0 - 52.0 %   MCV 69.5 (L) 78.0 - 100.0 fL   MCH 22.4 (L) 26.0 - 34.0 pg   MCHC 32.2 30.0 - 36.0 g/dL   RDW 17.3 (H) 11.5 - 15.5 %   Platelets 227 150 - 400 K/uL    Comment: RESULT REPEATED AND VERIFIED SPECIMEN CHECKED FOR CLOTS    Neutrophils Relative % 90 (H) 43 - 77 %   Neutro Abs 11.8 (H) 1.7 - 7.7 K/uL   Lymphocytes Relative 6 (L) 12 - 46 %   Lymphs Abs 0.8 0.7 - 4.0 K/uL   Monocytes Relative 4 3 - 12 %   Monocytes Absolute 0.5 0.1 - 1.0 K/uL   Eosinophils Relative 0 0 - 5 %   Eosinophils Absolute 0.0 0.0 - 0.7 K/uL   Basophils Relative 0 0 - 1 %   Basophils Absolute 0.0 0.0 - 0.1 K/uL   RBC Morphology ELLIPTOCYTES     Comment: TARGET CELLS POLYCHROMASIA PRESENT SPHEROCYTES    Smear Review PLATELETS APPEAR ADEQUATE   Comprehensive metabolic panel     Status: Abnormal  Collection Time: 11/24/2014  7:25 PM  Result Value Ref Range   Sodium 135 135 - 145 mmol/L   Potassium 3.5 3.5 - 5.1 mmol/L   Chloride 104 96 - 112 mmol/L   CO2 24 19 - 32 mmol/L   Glucose, Bld 95 70 - 99 mg/dL   BUN 17 6 - 23 mg/dL   Creatinine, Ser 1.08 0.50 - 1.35 mg/dL   Calcium 8.2 (L) 8.4 - 10.5 mg/dL   Total Protein 6.8 6.0 - 8.3 g/dL   Albumin 3.4 (L) 3.5 - 5.2 g/dL   AST 27 0 - 37 U/L   ALT 32 0 - 53 U/L   Alkaline Phosphatase 51 39 - 117 U/L   Total Bilirubin 1.8 (H) 0.3 - 1.2 mg/dL   GFR calc non Af Amer 86 (L) >90 mL/min   GFR calc Af Amer >90 >90 mL/min    Comment: (NOTE) The eGFR has been calculated using the CKD EPI equation. This calculation has not been validated in all clinical situations. eGFR's persistently <90 mL/min signify possible Chronic Kidney Disease.    Anion gap 7 5 - 15  Troponin I     Status: Abnormal    Collection Time: 12/01/2014  7:25 PM  Result Value Ref Range   Troponin I 0.41 (H) <0.031 ng/mL    Comment:        PERSISTENTLY INCREASED TROPONIN VALUES IN THE RANGE OF 0.04-0.49 ng/mL CAN BE SEEN IN:       -UNSTABLE ANGINA       -CONGESTIVE HEART FAILURE       -MYOCARDITIS       -CHEST TRAUMA       -ARRYHTHMIAS       -LATE PRESENTING MYOCARDIAL INFARCTION       -COPD   CLINICAL FOLLOW-UP RECOMMENDED.   Blood culture (routine x 2)     Status: None (Preliminary result)   Collection Time: 11/20/2014  7:25 PM  Result Value Ref Range   Specimen Description BLOOD LEFT ARM    Special Requests BOTTLES DRAWN AEROBIC AND ANAEROBIC 6CC EACH    Culture PENDING    Report Status PENDING   Blood culture (routine x 2)     Status: None (Preliminary result)   Collection Time: 11/29/2014  7:30 PM  Result Value Ref Range   Specimen Description BLOOD RIGHT ARM    Special Requests BOTTLES DRAWN AEROBIC AND ANAEROBIC 6CC EACH    Culture PENDING    Report Status PENDING   I-Stat CG4 Lactic Acid, ED     Status: None   Collection Time: 11/24/2014  7:43 PM  Result Value Ref Range   Lactic Acid, Venous 1.91 0.5 - 2.0 mmol/L   Dg Chest 2 View  11/26/2014   CLINICAL DATA:  Shortness of breath. Productive cough. Fever. Asthma. Hypertension.  EXAM: CHEST  2 VIEW  COMPARISON:  08/23/2014  FINDINGS: Moderate but stable enlargement of the cardiopericardial silhouette observed with indistinct pulmonary vasculature and hazy airspace opacities at the right lung base and in the right upper lobe. No pleural effusion.  IMPRESSION: 1. Moderate enlargement of the cardiopericardial silhouette with indistinct pulmonary vasculature favoring pulmonary venous hypertension. Hazy airspace opacities in the right lung may represent asymmetric edema or pneumonia.   Electronically Signed   By: Van Clines M.D.   On: 11/24/2014 19:05    Review of Systems  Constitutional: Positive for fever and weight loss.  HENT: Negative for ear  pain.   Eyes: Negative for blurred  vision and photophobia.  Respiratory: Positive for cough, shortness of breath and wheezing.   Cardiovascular: Positive for chest pain, orthopnea and leg swelling.  Gastrointestinal: Negative for nausea, vomiting and abdominal pain.  Genitourinary: Negative for dysuria and hematuria.  Musculoskeletal: Negative for myalgias and neck pain.  Neurological: Negative for dizziness, tingling and headaches.  Endo/Heme/Allergies: Negative for polydipsia. Does not bruise/bleed easily.  Psychiatric/Behavioral: Negative for depression, suicidal ideas and hallucinations.    Blood pressure 143/100, pulse 90, temperature 102.5 F (39.2 C), temperature source Oral, resp. rate 26, height 6' (1.829 m), weight 106.142 kg (234 lb), SpO2 100 %. Physical Exam  Nursing note and vitals reviewed. Constitutional: He is oriented to person, place, and time. He appears well-developed and well-nourished. He appears distressed.  HENT:  Head: Normocephalic and atraumatic.  Nose: Nose normal.  Mouth/Throat: Oropharynx is clear and moist. No oropharyngeal exudate.  Eyes: Conjunctivae and EOM are normal. Pupils are equal, round, and reactive to light. No scleral icterus.  Neck: Normal range of motion. Neck supple. JVD present.  Cardiovascular:  Tachycardic, regular, unable to fully assess for murmur/gallop on initial exam given tachypnea and wheezing  Respiratory: He is in respiratory distress. He has wheezes. He has rales.  GI: Soft. Bowel sounds are normal. He exhibits no distension. There is no tenderness.  Musculoskeletal: He exhibits edema.  2+ lower extremity edema  Neurological: He is alert and oriented to person, place, and time. No cranial nerve deficit. Coordination normal.  Skin: Skin is dry. No rash noted. He is not diaphoretic. No erythema.  lukewarm  Psychiatric: He has a normal mood and affect. His behavior is normal. Thought content normal.    Labs reviewed; wbc 13,  h/h 11.5/35.7, plt 200s, na 135, K 3.5, bun/cr 17/1.1, trop 0.4, ast/alt 27/32, albumin 3.4 Blood culture x 2 drawn EKG: sinus tachycardia 110s-120s, LVH, LAE Chest x-ray: right > left vascular fullness, enlarged cardiac silhouette, enlarged PA bilaterally  Assessment/Plan [name] is a 39 yo man with PMH of asthma, hypertension and tobacco use who presents with several weeks of worsening shortness of breath and lower extremity edema and an attempted to be treated URI a few weeks ago. Symptoms may have been ongoing since October. Differential diagnosis is viral myocarditis, pneumonia, other infections/sepsis, idiopathic pulmonary hypertension, congenital heart disease, pericardial effusion among many other possibilities.  Problem List Shortness of breath and lower extremity edema ? Respiratory Failure  Acute heart Failure ? CAP/HCAP  Elevated Troponin - ? Type II NSTEMI Leukocytosis  Sinus tachycardia Tobacco abuse Anemia   Plan: Evaluate for causes of symptoms of dyspnea, edema and possibility of ongoing pneumonia and heart failure. Currently hemodynamically stable but borderline stable respiratory status. I have already asked for a stat pulmonary consult. Will gently diuresis, obtain echocardiogram to assess LV function. Will trend troponins. Based on echocardiogram may need to consider etiologies of pulmonary hypertension as well. Would continue antibiotics for now, follow-up infectious workup.  - stat ABG, A/A nebulizers, pulmonary consult  - CTA to evaluate for PE - telemetry, trend cardiac biomarkers - echocardiogram in AM - IV diuresis 40 mg bid, goal 1-2L negative daily  - smoking cessation counseling daily  - broad spectrum antibiotics (vanco/zosyn currently) for possible HCAP, asymmetrical edema on chest x-ray   Evelisse Szalkowski 11/28/2014, 9:15 PM  Addendum: [name] had an eventful evening. He had a negative CTA for PE. He settled down with improved breathing until ~ 3-4 am  when he had increased work of breathing  and tachypnea. Broad spectrum antibiotics already initiated, pulmonary already following. He became confused, more tachypneic and repeat chest x-ray revealed diffuse opacities concerning for ARDS. He was intubated for respiratory failure. Viral panel, influenza panel sent. HIV also. Levofloxacin and tamiflu initiated as well. Clinical picture concerning for ARDS related to infectious process. He also appears to have some component of volume overload. Echocardiogram ordered for this AM. Pulmonary and Critical Care involved. Appreciate excellent, prompt assistance.   Addendum #2:  Please see Pulmonary & Critical Care Note for full details. Shortly after arrival to CCU he had a PEA arrest while pulmonary and cardiology were at bedside. We were never able to regain a pulse and time of death at 6:29. Family had previously been notified of critical illness and will be updated on arrival.

## 2014-11-17 NOTE — Progress Notes (Signed)
ANTIBIOTIC CONSULT NOTE - INITIAL  Pharmacy Consult for Vancomycin and Zosyn Indication: Bacteremia/Sepsis  Allergies  Allergen Reactions  . Lisinopril Swelling    Patient Measurements: Height: 6' (182.9 cm) Weight: 242 lb 1.6 oz (109.816 kg) IBW/kg (Calculated) : 77.6   Vital Signs: Temp: 99.1 F (37.3 C) (03/09 2335) Temp Source: Oral (03/09 2335) BP: 142/99 mmHg (03/09 2152) Pulse Rate: 109 (03/09 2152) Intake/Output from previous day:   Intake/Output from this shift: Total I/O In: -  Out: 1925 [Urine:1925]  Labs:  Recent Labs  06-21-15 1925  WBC 13.0*  HGB 11.5*  PLT 227  CREATININE 1.08   Estimated Creatinine Clearance: 119.9 mL/min (by C-G formula based on Cr of 1.08). No results for input(s): VANCOTROUGH, VANCOPEAK, VANCORANDOM, GENTTROUGH, GENTPEAK, GENTRANDOM, TOBRATROUGH, TOBRAPEAK, TOBRARND, AMIKACINPEAK, AMIKACINTROU, AMIKACIN in the last 72 hours.   Microbiology: Recent Results (from the past 720 hour(s))  Blood culture (routine x 2)     Status: None (Preliminary result)   Collection Time: 06-21-15  7:25 PM  Result Value Ref Range Status   Specimen Description BLOOD LEFT ARM  Final   Special Requests BOTTLES DRAWN AEROBIC AND ANAEROBIC [facility]6CC EACH  Final   Culture PENDING  Incomplete   Report Status PENDING  Incomplete  Blood culture (routine x 2)     Status: None (Preliminary result)   Collection Time: 06-21-15  7:30 PM  Result Value Ref Range Status   Specimen Description BLOOD RIGHT ARM  Final   Special Requests BOTTLES DRAWN AEROBIC AND ANAEROBIC [facility]CC EACH  Final   Culture PENDING  Incomplete   Report Status PENDING  Incomplete    Medical History: Past Medical History  Diagnosis Date  . Asthma   . Hypertension     Assessment: 39 y.o male who has been having increased SOB X 3 days - has been treated with Z pak and prednisone.  Worsening symptoms.  Received Ceftriaxone 1 gm IV x1 and azithromycin 500mg  po x1 in ED on 11/14/2014 PM.   Now to  start vancomycin and zosyn.  SCr 1.08. Estimated CrCl >100 ml/min.   Goal of Therapy:  Vancomycin trough level 15-20 mcg/ml  Plan:  Zosyn 3.375 gm IV q8h (4h infusion) Vancomycin 2 gm IV x1 then 1 gm IV q8h Monitor clinical status, renal function, culture results daily.  Check vancomycin trough at steady state.   Thank you for allowing pharmacy to be part of this patients care team. Noah Delaineuth Zalaya Astarita, RPh Clinical Pharmacist Pager: [phone] 12/02/2014,11:44 PM

## 2014-11-17 NOTE — ED Notes (Signed)
Paged cardiology through [facility] per Dr. Hyacinth MeekerMiller.

## 2014-11-17 NOTE — ED Provider Notes (Signed)
CSN: [id number]     Arrival date & time 15-Dec-2014  1839 History   First MD Initiated Contact with Patient December 15, 2014 1847     Chief Complaint  Patient presents with  . Shortness of Breath     (Consider location/radiation/quality/duration/timing/severity/associated sxs/prior Treatment) HPI Comments: Pt is a 39 y/o male with hx of htn, [facility] - has been having increased SOB X 3 days - has been treated with Z pak and prednisone and using his inhaler over last several months but not improving - he is having increased swelling in the legs as well.  Sx are constant, much worse today, sx are persistent, no associated abd pain but he does have CP and back pain.    Patient is a 39 y.o. male presenting with shortness of breath. The history is provided by the patient and medical records.  Shortness of Breath   Past Medical History  Diagnosis Date  . Asthma   . Hypertension    History reviewed. No pertinent past surgical history. History reviewed. No pertinent family history. History  Substance Use Topics  . Smoking status: Current Every Day Smoker -- 0.25 packs/day    Types: Cigarettes  . Smokeless tobacco: Not on file  . Alcohol Use: Yes     Comment: Rarely    Review of Systems  Respiratory: Positive for shortness of breath.   All other systems reviewed and are negative.     Allergies  Lisinopril  Home Medications   Prior to Admission medications   Medication Sig Start Date End Date Taking? Authorizing Provider  amLODipine (NORVASC) 10 MG tablet Take 1 tablet (10 mg total) by mouth daily. 08/23/14  Yes Zadie Rhine, MD  furosemide (LASIX) 20 MG tablet Take 1 tablet (20 mg total) by mouth daily. 08/23/14  Yes Zadie Rhine, MD  azithromycin (ZITHROMAX) 250 MG tablet One tablet PO daily for 4 days Patient not taking: Reported on 12-15-2014 08/23/14   Zadie Rhine, MD   BP 130/93 mmHg  Pulse 110  Temp(Src) 102.5 F (39.2 C) (Oral)  Resp 28  Ht 6'  (1.829 m)  Wt 234 lb (106.142 kg)  BMI 31.73 kg/m2  SpO2 91% Physical Exam  Constitutional: He appears well-developed and well-nourished. No distress.  HENT:  Head: Normocephalic and atraumatic.  Mouth/Throat: Oropharynx is clear and moist. No oropharyngeal exudate.  Eyes: Conjunctivae and EOM are normal. Pupils are equal, round, and reactive to light. Right eye exhibits no discharge. Left eye exhibits no discharge. No scleral icterus.  Neck: Normal range of motion. Neck supple. No JVD present. No thyromegaly present.  Cardiovascular: Normal rate, regular rhythm, normal heart sounds and intact distal pulses.  Exam reveals no gallop and no friction rub.   No murmur heard. Pulmonary/Chest: He is in respiratory distress. He has no wheezes. He has no rales.  Abdominal: Soft. Bowel sounds are normal. He exhibits no distension and no mass. There is no tenderness.  Musculoskeletal: Normal range of motion. He exhibits edema ( bilateral symmetrial 2+ pitting edema.). He exhibits no tenderness.  Lymphadenopathy:    He has no cervical adenopathy.  Neurological: He is alert. Coordination normal.  Skin: Skin is warm and dry. No rash noted. No erythema.  Psychiatric: He has a normal mood and affect. His behavior is normal.  Nursing note and vitals reviewed.   ED Course  Procedures (including critical care time) Labs Review Labs Reviewed  CBC WITH DIFFERENTIAL/PLATELET - Abnormal; Notable for the following:  WBC 13.0 (*)    Hemoglobin 11.5 (*)    HCT 35.7 (*)    MCV 69.5 (*)    MCH 22.4 (*)    RDW 17.3 (*)    Neutrophils Relative % 90 (*)    Neutro Abs 11.8 (*)    Lymphocytes Relative 6 (*)    All other components within normal limits  COMPREHENSIVE METABOLIC PANEL - Abnormal; Notable for the following:    Calcium 8.2 (*)    Albumin 3.4 (*)    Total Bilirubin 1.8 (*)    GFR calc non Af Amer 86 (*)    All other components within normal limits  TROPONIN I - Abnormal; Notable for the  following:    Troponin I 0.41 (*)    All other components within normal limits  CULTURE, BLOOD (ROUTINE X 2)  CULTURE, BLOOD (ROUTINE X 2)  URINE CULTURE  BRAIN NATRIURETIC PEPTIDE  URINALYSIS, ROUTINE W REFLEX MICROSCOPIC  I-STAT CG4 LACTIC ACID, ED    Imaging Review Dg Chest 2 View  12/05/2014   CLINICAL DATA:  Shortness of breath. Productive cough. Fever. Asthma. Hypertension.  EXAM: CHEST  2 VIEW  COMPARISON:  08/23/2014  FINDINGS: Moderate but stable enlargement of the cardiopericardial silhouette observed with indistinct pulmonary vasculature and hazy airspace opacities at the right lung base and in the right upper lobe. No pleural effusion.  IMPRESSION: 1. Moderate enlargement of the cardiopericardial silhouette with indistinct pulmonary vasculature favoring pulmonary venous hypertension. Hazy airspace opacities in the right lung may represent asymmetric edema or pneumonia.   Electronically Signed   By: Gaylyn RongWalter  Liebkemann M.D.   On: 11/27/2014 19:05     EKG Interpretation   Date/Time:  Wednesday November 17 2014 19:11:58 EST Ventricular Rate:  119 PR Interval:  161 QRS Duration: 104 QT Interval:  336 QTC Calculation: 473 R Axis:     Text Interpretation:  Sinus tachycardia Probable left atrial enlargement  LVH with secondary repolarization abnormality Artifact in lead(s) II III  aVF V3 V4 Abnormal ekg Since last tracing rate faster Confirmed by Lareen Mullings   MD, Danyell Awbrey ([id number]) on 11/24/2014 7:22:58 PM      MDM   Final diagnoses:  Acute myocarditis  Acute congestive heart failure, unspecified congestive heart failure type    Pt has high fever, persistent cough and tachycpnea - new swelling, w/u as sepsis, possible cardiac, less likely PE but would consider.  The patient has ongoing fever, tachycardia, tachypnea, occasional hypoxia. He has no chest pain but does have significant findings on his chest x-ray that suggest congestive heart failure with cardiomegaly and some pulmonary  edema. He has a large appearing pulmonary artery which could also be consistent with pulmonary hypertension secondary to acute myocarditis.  White blood cell count 13,000, lactic acid 1.9, hemoglobin 11.5, electrolytes normal, renal function normal, troponin elevated at 0.4.  Again with fever, new onset congestive heart failure and a elevated troponin I suspect that this is from myocarditis, this makes sense as the patient has recently had a respiratory infection within the last 3 weeks. He does appear critically ill with pulmonary edema, fever and cardiac dysfunction. He will receive antibiotics, Lasix.  I discussed his care with the cardiologist, [facility] Leeann MustJacob Kelly, [facility] who has accepted the patient in transfer.  CRITICAL CARE Performed by: Vida RollerMILLER,Kadeidra Coryell D Total critical care time: 35 Critical care time was exclusive of separately billable procedures and treating other patients. Critical care was necessary to treat or prevent imminent or life-threatening  deterioration. Critical care was time spent personally by me on the following activities: development of treatment plan with patient and/or surrogate as well as nursing, discussions with consultants, evaluation of patient's response to treatment, examination of patient, obtaining history from patient or surrogate, ordering and performing treatments and interventions, ordering and review of laboratory studies, ordering and review of radiographic studies, pulse oximetry and re-evaluation of patient's condition.  Meds given in ED:  Medications  ibuprofen (ADVIL,MOTRIN) tablet 800 mg (800 mg Oral Given 12/04/14 1853)  sodium chloride 0.9 % bolus 1,000 mL (0 mLs Intravenous Stopped 12-04-2014 2106)  sodium chloride 0.9 % bolus 1,000 mL (0 mLs Intravenous Stopped 04-Dec-2014 2106)  cefTRIAXone (ROCEPHIN) 1 g in dextrose 5 % 50 mL IVPB (0 g Intravenous Stopped 12/04/2014 1958)  azithromycin (ZITHROMAX) tablet 500 mg (500 mg Oral Given December 04, 2014 1917)   sodium chloride 0.9 % bolus 1,000 mL (1,000 mLs Intravenous New Bag/Given 2014-12-04 2111)  sodium chloride 0.9 % bolus 250 mL (250 mLs Intravenous New Bag/Given December 04, 2014 2111)  furosemide (LASIX) injection 40 mg (40 mg Intravenous Given 12-04-14 2111)      Eber Hong, MD 12/04/2014 2119

## 2014-11-17 NOTE — ED Notes (Signed)
Pt reports has history of asthma and c/o sob and severe coughing.  Reports [facility] gave him [facility] zpack and prednisone.  Pt says took the medication but when ran out, symptoms returned.  Pt unable to speak complete sentences without coughing.  Finished prednisone 2 days ago.

## 2014-11-18 ENCOUNTER — Inpatient Hospital Stay (HOSPITAL_COMMUNITY): Payer: Medicaid Other

## 2014-11-18 ENCOUNTER — Inpatient Hospital Stay (HOSPITAL_COMMUNITY): Payer: Medicaid Other | Admitting: Anesthesiology

## 2014-11-18 ENCOUNTER — Encounter (HOSPITAL_COMMUNITY): Payer: Self-pay | Admitting: Radiology

## 2014-11-18 DIAGNOSIS — I409 Acute myocarditis, unspecified: Principal | ICD-10-CM

## 2014-11-18 DIAGNOSIS — Z01818 Encounter for other preprocedural examination: Secondary | ICD-10-CM

## 2014-11-18 DIAGNOSIS — I509 Heart failure, unspecified: Secondary | ICD-10-CM | POA: Insufficient documentation

## 2014-11-18 DIAGNOSIS — D72829 Elevated white blood cell count, unspecified: Secondary | ICD-10-CM

## 2014-11-18 DIAGNOSIS — I5041 Acute combined systolic (congestive) and diastolic (congestive) heart failure: Secondary | ICD-10-CM

## 2014-11-18 LAB — CBC
HEMATOCRIT: 36.3 % — AB (ref 39.0–52.0)
HEMOGLOBIN: 11.9 g/dL — AB (ref 13.0–17.0)
MCH: 22.5 pg — AB (ref 26.0–34.0)
MCHC: 32.8 g/dL (ref 30.0–36.0)
MCV: 68.5 fL — ABNORMAL LOW (ref 78.0–100.0)
Platelets: 198 10*3/uL (ref 150–400)
RBC: 5.3 MIL/uL (ref 4.22–5.81)
RDW: 18.4 % — ABNORMAL HIGH (ref 11.5–15.5)
WBC: 12.8 10*3/uL — AB (ref 4.0–10.5)

## 2014-11-18 LAB — PROTIME-INR
INR: 1.26 (ref 0.00–1.49)
Prothrombin Time: 15.9 seconds — ABNORMAL HIGH (ref 11.6–15.2)

## 2014-11-18 LAB — MRSA PCR SCREENING: MRSA by PCR: NEGATIVE

## 2014-11-18 LAB — BLOOD GAS, ARTERIAL
ACID-BASE DEFICIT: 2.7 mmol/L — AB (ref 0.0–2.0)
Bicarbonate: 20.5 mEq/L (ref 20.0–24.0)
Drawn by: 405301
O2 CONTENT: 3 L/min
O2 Saturation: 86.9 %
PCO2 ART: 29.5 mmHg — AB (ref 35.0–45.0)
PH ART: 7.458 — AB (ref 7.350–7.450)
PO2 ART: 56.3 mmHg — AB (ref 80.0–100.0)
Patient temperature: 99.6
TCO2: 21.3 mmol/L (ref 0–100)

## 2014-11-18 LAB — APTT: APTT: 25 s (ref 24–37)

## 2014-11-18 LAB — CREATININE, SERUM
Creatinine, Ser: 1.1 mg/dL (ref 0.50–1.35)
GFR calc Af Amer: >90 mL/min (ref >90)
GFR, EST NON AFRICAN AMERICAN: 84 mL/min — AB (ref >90)

## 2014-11-18 LAB — TSH: TSH: 0.422 u[IU]/mL (ref 0.350–4.500)

## 2014-11-18 LAB — TROPONIN I: TROPONIN I: 0.33 ng/mL — AB (ref <0.031)

## 2014-11-18 LAB — BRAIN NATRIURETIC PEPTIDE: B Natriuretic Peptide: 630.9 pg/mL — ABNORMAL HIGH (ref 0.0–100.0)

## 2014-11-18 LAB — MAGNESIUM: Magnesium: 1.9 mg/dL (ref 1.5–2.5)

## 2014-11-18 LAB — HIV ANTIBODY (ROUTINE TESTING W REFLEX): HIV SCREEN 4TH GENERATION: NONREACTIVE

## 2014-11-18 MED ORDER — IOHEXOL 350 MG/ML SOLN
80.0000 mL | Freq: Once | INTRAVENOUS | Status: AC | PRN
  Administered 2014-11-18: 100 mL via INTRAVENOUS

## 2014-11-18 MED ORDER — CETYLPYRIDINIUM CHLORIDE 0.05 % MT LIQD: 7.0000 mL | Freq: Four times a day (QID) | OROMUCOSAL | Status: DC

## 2014-11-18 MED ORDER — BENZONATATE 100 MG PO CAPS
100.0000 mg | ORAL_CAPSULE | Freq: Three times a day (TID) | ORAL | Status: DC | PRN
  Filled 2014-11-18: qty 1

## 2014-11-18 MED ORDER — SODIUM CHLORIDE 0.9 % IV SOLN: INTRAVENOUS | Status: DC

## 2014-11-18 MED ORDER — HALOPERIDOL LACTATE 5 MG/ML IJ SOLN
INTRAMUSCULAR | Status: AC
  Filled 2014-11-18: qty 1

## 2014-11-18 MED ORDER — FENTANYL CITRATE 0.05 MG/ML IJ SOLN: 100.0000 ug | INTRAMUSCULAR | Status: DC | PRN

## 2014-11-18 MED ORDER — PANTOPRAZOLE SODIUM 40 MG IV SOLR: 40.0000 mg | Freq: Every day | INTRAVENOUS | Status: DC

## 2014-11-18 MED ORDER — FUROSEMIDE 10 MG/ML IJ SOLN: 40.0000 mg | Freq: Once | INTRAMUSCULAR | Status: DC

## 2014-11-18 MED ORDER — SUCCINYLCHOLINE CHLORIDE 20 MG/ML IJ SOLN
INTRAMUSCULAR | Status: DC | PRN
  Administered 2014-11-18: 200 mg via INTRAVENOUS

## 2014-11-18 MED ORDER — ATROPINE SULFATE 0.1 MG/ML IJ SOLN
INTRAMUSCULAR | Status: AC
  Filled 2014-11-18: qty 10

## 2014-11-18 MED ORDER — LORAZEPAM 2 MG/ML IJ SOLN
1.0000 mg | INTRAMUSCULAR | Status: DC | PRN
  Administered 2014-11-18: 1 mg via INTRAVENOUS
  Filled 2014-11-18 (×2): qty 1

## 2014-11-18 MED ORDER — LORAZEPAM 2 MG/ML IJ SOLN
INTRAMUSCULAR | Status: AC
  Filled 2014-11-18: qty 1

## 2014-11-18 MED ORDER — MAGNESIUM SULFATE 2 GM/50ML IV SOLN: 2.0000 g | Freq: Once | INTRAVENOUS | Status: DC

## 2014-11-18 MED ORDER — GUAIFENESIN-CODEINE 100-10 MG/5ML PO SOLN
10.0000 mL | ORAL | Status: DC | PRN
  Administered 2014-11-18: 10 mL via ORAL
  Filled 2014-11-18: qty 10

## 2014-11-18 MED ORDER — LORAZEPAM 2 MG/ML IJ SOLN
1.0000 mg | Freq: Four times a day (QID) | INTRAMUSCULAR | Status: DC | PRN
  Administered 2014-11-18: 1 mg via INTRAVENOUS

## 2014-11-18 MED ORDER — FUROSEMIDE 10 MG/ML IJ SOLN
40.0000 mg | Freq: Once | INTRAMUSCULAR | Status: AC
  Administered 2014-11-18: 40 mg via INTRAVENOUS
  Filled 2014-11-18: qty 4

## 2014-11-18 MED ORDER — CHLORHEXIDINE GLUCONATE 0.12 % MT SOLN
15.0000 mL | Freq: Two times a day (BID) | OROMUCOSAL | Status: DC
Start: 2014-11-18 — End: 2014-11-18

## 2014-11-18 MED ORDER — OSELTAMIVIR PHOSPHATE 6 MG/ML PO SUSR
75.0000 mg | Freq: Two times a day (BID) | ORAL | Status: DC
  Filled 2014-11-18 (×2): qty 12.5

## 2014-11-18 MED ORDER — IPRATROPIUM-ALBUTEROL 0.5-2.5 (3) MG/3ML IN SOLN: 3.0000 mL | RESPIRATORY_TRACT | Status: DC | PRN

## 2014-11-18 MED ORDER — PROPOFOL 10 MG/ML IV BOLUS
INTRAVENOUS | Status: DC | PRN
  Administered 2014-11-18: 200 mg via INTRAVENOUS

## 2014-11-18 MED ORDER — LEVOFLOXACIN IN D5W 750 MG/150ML IV SOLN
750.0000 mg | INTRAVENOUS | Status: DC
  Filled 2014-11-18: qty 150

## 2014-11-18 MED ORDER — CETYLPYRIDINIUM CHLORIDE 0.05 % MT LIQD: 7.0000 mL | Freq: Two times a day (BID) | OROMUCOSAL | Status: DC

## 2014-11-18 MED ORDER — LEVALBUTEROL HCL 0.63 MG/3ML IN NEBU
0.6300 mg | INHALATION_SOLUTION | RESPIRATORY_TRACT | Status: DC | PRN
  Administered 2014-11-18: 0.63 mg via RESPIRATORY_TRACT
  Filled 2014-11-18: qty 3

## 2014-11-18 MED ORDER — PROPOFOL 10 MG/ML IV EMUL
0.0000 ug/kg/min | INTRAVENOUS | Status: DC
  Filled 2014-11-18: qty 100

## 2014-11-19 LAB — URINE CULTURE
COLONY COUNT: NO GROWTH
Culture: NO GROWTH

## 2014-11-19 LAB — HEMOGLOBIN A1C
HEMOGLOBIN A1C: 6 % — AB (ref 4.8–5.6)
MEAN PLASMA GLUCOSE: 126 mg/dL

## 2014-11-20 MED FILL — Medication: Qty: 1 | Status: AC

## 2014-11-22 LAB — CULTURE, BLOOD (ROUTINE X 2)
CULTURE: NO GROWTH
Culture: NO GROWTH

## 2014-12-10 NOTE — Progress Notes (Signed)
Patient arrived from [facility] ED. On call MD paged. Patient's HR in 130s, tachypnic. MD aware and came to floor, new order for Ativan, CT and Lasix given. PCCM came to evaluate the patient as well. Will continue to monitor.

## 2014-12-10 NOTE — Procedures (Signed)
Arterial Catheter Insertion Procedure Note - during code Dillon Valdez [id number] [birth year]  Procedure: Insertion of Arterial Catheter  Indications: during code  Procedure Details Consent: Unable to obtain consent because of emergent medical necessity. Time Out: Verified patient identification, verified procedure, site/side was marked, verified correct patient position, special equipment/implants available, medications/allergies/relevent history reviewed, required imaging and test results available.  Performed  Maximum sterile technique was used including antiseptics, cap, gloves, gown and hand hygiene. Skin prep: Chlorhexidine; local anesthetic administered 20 gauge catheter was inserted into right femoral artery using the Seldinger technique.  Evaluation Blood flow good; BP tracing good. Complications: No apparent complications.   Nelda BucksFEINSTEIN,DANIEL J. January 06, 2015

## 2014-12-10 NOTE — Procedures (Signed)
ACLS  Coded upon arrival to 2H PEA, some VT, VF, to asystole to PEA ACLS followed including empiric treatment hyperK  Unable to resuscitate Pronounced dead at 629 am  Family on way in   Dillon Valdez [name], MD, FACP Pgr: [phone] [place]

## 2014-12-10 NOTE — Progress Notes (Addendum)
Called to [address] at [phone] per floor RN regarding pt increasing SOB. Pt recently admitted from [facility] for increasing SOB and swelling in legs. CTA negative for PE this evening. Upon my arrival at 0500 pt found in bed, agitated in clear distress. Pt pulling off 02 mask attempting to get out of be, confused. Lungs course with crackles heard throughout all bases. Po2 60s-70s on NRB. Stat CXR ordered. Reoriented several times. Pt states "I cant breath with this mask on!" After several attempts to calm patient security called as he became physically violent and pushed staff members with force while attempting to get out of bed. Dr.Kelly paged to bedside and upon arrival ordered 40 mg Lasix, 2mg  Ativan and 5 mg Haldol. Drugs given as pt restrained with assist from several staff members and security to prevent harm to him and others. CXR done at bedside. PCCM P.A R.Desai consulted and at bedside. Anesthesia called to intubate. Pt intubated at 0605 size 8 tube 23 cm at lip per Anesthesiologist. Following intubation pt HR 50s with pulse. Pt place on zoll monitor and transfered to 2H14 with atropine at bedside during transfer. Once in 2 H pt settled in ICU bed and placed on ICU monitor HR 35-40 with faint pulse. Atropine given X 1. Pulse checked again unable to find, PEA.  Code blue called, see code sheet for details. Pt time of death 0629 per Dr. Tyson [name].

## 2014-12-10 NOTE — Anesthesia Preprocedure Evaluation (Signed)
Anesthesia Evaluation  Patient identified by MRN, date of birth, ID band Patient awake    Reviewed: Allergy & Precautions, NPO status , Patient's Chart, lab work & pertinent test results  Airway        Dental   Pulmonary shortness of breath, asthma , Current Smoker,          Cardiovascular hypertension, Pt. on medications + Past MI and +CHF     Neuro/Psych negative neurological ROS  negative psych ROS   GI/Hepatic negative GI ROS, Neg liver ROS,   Endo/Other  negative endocrine ROS  Renal/GU negative Renal ROS     Musculoskeletal negative musculoskeletal ROS (+)   Abdominal   Peds  Hematology negative hematology ROS (+)   Anesthesia Other Findings   Reproductive/Obstetrics                             Anesthesia Physical Anesthesia Plan  ASA: III and emergent  Anesthesia Plan: General   Post-op Pain Management:    Induction: Intravenous, Rapid sequence and Cricoid pressure planned  Airway Management Planned: Oral ETT  Additional Equipment:   Intra-op Plan:   Post-operative Plan:   Informed Consent:   Plan Discussed with:   Anesthesia Plan Comments:         Anesthesia Quick Evaluation

## 2014-12-10 NOTE — Anesthesia Procedure Notes (Signed)
Procedure Name: Intubation Performed by: Lewie LoronGERMEROTH, Shandrea Lusk Pre-anesthesia Checklist: Patient identified, Emergency Drugs available, Timeout performed, Patient being monitored and Suction available Patient Re-evaluated:Patient Re-evaluated prior to inductionOxygen Delivery Method: Ambu bag and Non-rebreather mask Preoxygenation: Pre-oxygenation with 100% oxygen Intubation Type: IV induction Ventilation: Mask ventilation without difficulty Laryngoscope Size: Miller and 2 Grade View: Grade I Tube type: Subglottic suction tube Tube size: 8.0 mm Number of attempts: 1 Placement Confirmation: ETT inserted through vocal cords under direct vision,  CO2 detector and breath sounds checked- equal and bilateral Secured at: 23 cm Dental Injury: Teeth and Oropharynx as per pre-operative assessment

## 2014-12-10 NOTE — Addendum Note (Signed)
Addendum  created March 28, 2015 [id number] by Lewie LoronJohn Shakara Tweedy, MD   Modules edited: Charges VN

## 2014-12-10 NOTE — Care Management Note (Signed)
    Page 1 of 1   06-16-2015     9:33:03 AM CARE MANAGEMENT NOTE 06-16-2015  Patient:  Dillon Valdez,Dillon Valdez   Account Number:  [id number]  Date Initiated:  010-02-2015  Documentation initiated by:  Junius CreamerWELL,DEBBIE  Subjective/Objective Assessment:   adm w heart failure, arrest-vent     Action/Plan:   lives w wife   Anticipated DC Date:     Anticipated DC Plan:  HOME W HOME HEALTH SERVICES         Choice offered to / List presented to:             Status of service:   Medicare Important Message given?   (If response is "NO", the following Medicare IM given date fields will be blank) Date Medicare IM given:   Medicare IM given by:   Date Additional Medicare IM given:   Additional Medicare IM given by:    Discharge Disposition:    Per UR Regulation:  Reviewed for med. necessity/level of care/duration of stay  If discussed at Long Length of Stay Meetings, dates discussed:    Comments:

## 2014-12-10 NOTE — Progress Notes (Signed)
PULMONARY / CRITICAL CARE MEDICINE   Name: Dillon Valdez MRN: [id number] DOB: 16-Jan-1976    ADMISSION DATE:  12/03/2014 CONSULTATION DATE:  October 30, 2014  REFERRING MD : Patty SermonsBrackbill  CHIEF COMPLAINT: SOB, cough  BRIEF PATIENT DESCRIPTION: 39 y.o. M brought to [facility] 3/9 with persistent SOB and cough. [facility]. [facility], became more SOB and tachypneic. PCCM consulted.  Roughly 3.5 - 4 hours later, pt had significant decline, became extremely agitated requiring multiple nurses and security officers to restrain him.  In addition, his hypoxia worsened with SpO2 down into 60's despite NRB.  CXR much worse, now with bilateral opacities. Anesthesia called emergently for intubation.  SIGNIFICANT EVENTS  3/9 - admit 3/10 - PCCM consult.  Later deteriorated, required emergent intubation by anesthesia.  STUDIES:  CXR 3/9 >>> Mod enlargement of cardiopericardial silhouette, ? pulm htn, hazy airspace opacities in right lung - ? asymmetric edema or PNA. CTA 3/10 >>> neg for PE. Diffuse airspace disease bilaterally. Nonspecific enlarged mediastinal lymph nodes. Cardiac enlargement.   SUBJECTIVE:  Extremely agitated.  Multiple security and nurses restraining him.  Respirations shallow, paradoxical.  SpO2 in 70's.  VITAL SIGNS: Temp:  [99 F (37.2 C)-102.5 F (39.2 C)] 99.6 F (37.6 C) (03/10 0101) Pulse Rate:  [90-129] 126 (03/10 0434) Resp:  [26-38] 30 (03/10 0434) BP: (130-150)/(93-101) 150/101 mmHg (03/10 0101) SpO2:  [90 %-100 %] 92 % (03/10 0113) Weight:  [106.142 kg (234 lb)-109.816 kg (242 lb 1.6 oz)] 109.816 kg (242 lb 1.6 oz) (03/09 2335) HEMODYNAMICS:   VENTILATOR SETTINGS:   INTAKE / OUTPUT: Intake/Output      03/09 0701 - 03/10 0700   Urine (mL/kg/hr) 2150   Total Output 2150   Net -2150         PHYSICAL EXAMINATION: General: WDWN male, extremely agitated, multiple security and RN's having to restrain  him. Neuro: Extremely agitated, not following commands, non-focal. HEENT: [place]/AT. PERRL, sclerae anicteric. Cardiovascular: Tachy, regular, no M/R/G.  Lungs: Respirations shallow and rapid.  Coarse rhonchi bilaterally. Abdomen: BS x 4, soft, NT/ND.  Musculoskeletal: No gross deformities, no edema.  Skin: Intact, warm, no rashes.  LABS:  CBC  Recent Labs Lab 11/26/2014 1925 03-25-15 0014  WBC 13.0* 12.8*  HGB 11.5* 11.9*  HCT 35.7* 36.3*  PLT 227 198   Coag's  Recent Labs Lab 03-25-15 0014  APTT 25  INR 1.26   BMET  Recent Labs Lab 11/11/2014 1925 03-25-15 0014  NA 135  --   K 3.5  --   CL 104  --   CO2 24  --   BUN 17  --   CREATININE 1.08 1.10  GLUCOSE 95  --    Electrolytes  Recent Labs Lab 11/16/2014 1925 03-25-15 0014  CALCIUM 8.2*  --   MG  --  1.9   Sepsis Markers  Recent Labs Lab 12/02/2014 1943  LATICACIDVEN 1.91   ABG  Recent Labs Lab 03-25-15 0115  PHART 7.458*  PCO2ART 29.5*  PO2ART 56.3*   Liver Enzymes  Recent Labs Lab 11/11/2014 1925  AST 27  ALT 32  ALKPHOS 51  BILITOT 1.8*  ALBUMIN 3.4*   Cardiac Enzymes  Recent Labs Lab 11/22/2014 1925 03-25-15 0014  TROPONINI 0.41* 0.33*   Glucose No results for input(s): GLUCAP in the last 168 hours.  Imaging Dg Chest 2 View  12/01/2014   CLINICAL DATA:  Shortness of breath. Productive cough. Fever. Asthma. Hypertension.  EXAM: CHEST  2 VIEW  COMPARISON:  08/23/2014  FINDINGS: Moderate but stable enlargement of the cardiopericardial silhouette observed with indistinct pulmonary vasculature and hazy airspace opacities at the right lung base and in the right upper lobe. No pleural effusion.  IMPRESSION: 1. Moderate enlargement of the cardiopericardial silhouette with indistinct pulmonary vasculature favoring pulmonary venous hypertension. Hazy airspace opacities in the right lung may represent asymmetric edema or pneumonia.   Electronically Signed   By: Gaylyn Rong M.D.   On:  11/25/2014 19:05    ASSESSMENT / PLAN:  PULMONARY OETT 3/10 >>> A: Acute hypoxic respiratory failure ? ARDS - AM CXR significantly worse from prior ? Bilateral / atypical PNA ? influenza Asthma with ? Exacerbation Respiratory alkalosis Pulmonary edema Tobacco use disorder P:   Full mechanical support. May need ARDS protocol. VAP bundle. SBT when able. Xopenex q3hrs PRN. Abx / antivirals per ID section. ABG and CXR. Tobacco cessation counseling.  CARDIOVASCULAR A:  Troponin leak ? CHF ? myocarditis LE edema P:  Trend troponins. Echo. Aggressive diuresis as BP / SCr permit.  RENAL A:   No acute issues P:   NS @ 100. BMP in AM.  GASTROINTESTINAL A:   GI prophylaxis Nutrition P:   SUP: Pantoprazole. NPO. TF if remains NPO > 24 hours.  HEMATOLOGIC A:   VTE Prophylaxis P:  SCD's / Heparin. CBC in AM.  INFECTIOUS A:   ? Bilateral / atypical PNA vs influenza P:   BCx2 3/10 > UCx 3/10 > Sputum Cx 3/10 > RVP 3/10 > U. Legionella 3/10 > U. Strep 3/10 > HIV antibody 3/10 > Abx: Vanc, start date 3/10, day 1/x. Abx: Zosyn, start date 3/10, day 1/x. Abx: Levaquin, start date 3/10, day 1/x. Abx: Tamiflu, start date 3/10, day 1/x. PCT algorithm to limit abx exposure.  ENDOCRINE A:   No known issues  P:   Monitor glucose.  NEUROLOGIC A:   Acute metabolic / toxic encephalopathy P:   Sedation:  Propofol gtt / Fentanyl PRN. RASS goal: 0 to -1. Daily WUA.   Family updated: Mother over phone.  Interdisciplinary Family Meeting v Palliative Care Meeting:  Due by: 3/16.   Rutherford Guys, [place] [facility] [place] [facility] Pager: [phone]  or [phone] November 25, 2014, 5:34 AM   See prior note Agree with management  Mcarthur Rossetti. Tyson Alias, MD, FACP Pgr: [phone]  Pulmonary & Critical Care

## 2014-12-10 NOTE — Procedures (Signed)
Limited STAT echo during arrest  1. Dilated LV, LVH 2. No appreciable significant pericardial effusion  Mcarthur Rossettianiel J. Tyson [name], MD, FACP Pgr: [phone] [place] [facility]

## 2014-12-10 NOTE — Progress Notes (Signed)
ANTIBIOTIC CONSULT NOTE - INITIAL  Pharmacy Consult for Levofloxacin  Indication: HCAP  Allergies  Allergen Reactions  . Lisinopril Swelling    Patient Measurements: Height: 6' (182.9 cm) Weight: 242 lb 1.6 oz (109.816 kg) IBW/kg (Calculated) : 77.6   Vital Signs: Temp: 99.6 F (37.6 C) (03/10 0101) Temp Source: Oral (03/10 0101) BP: 150/101 mmHg (03/10 0101) Pulse Rate: 126 (03/10 0434) Intake/Output from previous day: 03/09 0701 - 03/10 0700 In: -  Out: 2150 [Urine:2150] Intake/Output from this shift: Total I/O In: -  Out: 2150 [Urine:2150]  Labs:  Recent Labs  11/25/14 1925 12/03/2014 0014  WBC 13.0* 12.8*  HGB 11.5* 11.9*  PLT 227 198  CREATININE 1.08 1.10   Estimated Creatinine Clearance: 117.7 mL/min (by C-G formula based on Cr of 1.1). No results for input(s): VANCOTROUGH, VANCOPEAK, VANCORANDOM, GENTTROUGH, GENTPEAK, GENTRANDOM, TOBRATROUGH, TOBRAPEAK, TOBRARND, AMIKACINPEAK, AMIKACINTROU, AMIKACIN in the last 72 hours.   Microbiology: Recent Results (from the past 720 hour(s))  Blood culture (routine x 2)     Status: None (Preliminary result)   Collection Time: 11/25/14  7:25 PM  Result Value Ref Range Status   Specimen Description BLOOD LEFT ARM  Final   Special Requests BOTTLES DRAWN AEROBIC AND ANAEROBIC [facility] EACH  Final   Culture PENDING  Incomplete   Report Status PENDING  Incomplete  Blood culture (routine x 2)     Status: None (Preliminary result)   Collection Time: 11/25/14  7:30 PM  Result Value Ref Range Status   Specimen Description BLOOD RIGHT ARM  Final   Special Requests BOTTLES DRAWN AEROBIC AND ANAEROBIC [facility]CC EACH  Final   Culture PENDING  Incomplete   Report Status PENDING  Incomplete  MRSA PCR Screening     Status: None   Collection Time: 11/25/2014  1:32 AM  Result Value Ref Range Status   MRSA by PCR NEGATIVE NEGATIVE Final    Comment:        The GeneXpert MRSA Assay (FDA approved for NASAL specimens only), is one component of  a comprehensive MRSA colonization surveillance program. It is not intended to diagnose MRSA infection nor to guide or monitor treatment for MRSA infections.     Medical History: Past Medical History  Diagnosis Date  . Asthma   . Hypertension     Assessment: 39 y.o male started on  vancomycin and zosyn for sepsis/bacteremia. Now to add levofloxacin for HCAP coverage. SCr 1.1, CrCl ~ >100 ml/min   Goal of Therapy:  Vancomycin trough level 15-20 mcg/ml  Plan:  Levofloxacin 750mg  IV q24h.  Continue Zosyn and vancomycin as previously ordered. Monitor clinical status, renal function, culture results daily.  Check vancomycin trough at steady state.   Thank you for allowing pharmacy to be part of this patients care team. Dillon Valdez, RPh Clinical Pharmacist Pager: [phone] 11/13/2014,5:42 AM

## 2014-12-10 NOTE — Consult Note (Signed)
Name: Dillon Chenthony J Ahmed MRN: [id number] DOB: [birth year]    ADMISSION DATE:  11/23/2014 CONSULTATION DATE:  22-Jun-2015  REFERRING MD :  Patty SermonsBrackbill  CHIEF COMPLAINT:  SOB, cough  BRIEF PATIENT DESCRIPTION: 39 y.o. M brought to [facility] 3/9 with persistent SOB and cough. [facility].  [facility], became more SOB and tachypneic.  PCCM consulted.  SIGNIFICANT EVENTS  3/9 - admit 3/10 - PCCM consult  STUDIES:  CXR 3/9 >>>  Mod enlargement of cardiopericardial silhouette, ? pulm htn, hazy airspace opacities in right lung - ? asymmetric edema or PNA. CTA 3/10 >>> neg for PE.  Diffuse airspace disease bilaterally.  Nonspecific enlarged mediastinal lymph nodes.  Cardiac enlargement.   HISTORY OF PRESENT ILLNESS:  Dillon Valdez is a 39 y.o. M with PMH of asthma and HTN.  [facility] about 2 weeks ago with a Zpack and prednisone.  Since then, he has had continued SOB and cough which has been worse since he has completed his medications.  He reports he had PNA in October or November 2015 which was treated as an outpatient.  He presented to [facility] 3/9 due to worsened cough and SOB.  Cough was somewhat productive of a brownish colored sputum, ? Whether there was some blood tinging.  On CXR, he had cardiomegaly with vascular congestion and there was concern for viral myocarditis; therefore, [facility]. In addition to his cough and SOB, he reports mild chest discomfort associated with coughing as well as general back ache.  He has LE edema at baseline; however, over the past few days, it has been much worse then usual. No recent travel or exposure to known sick contacts.  [facility], he became more tachypneic and felt more SOB.  He was given a breathing treatment and an additional 40mg  lasix.  He feels that he can't breathe very well.   ABG 7.45 / 29.5 / 56.3.  CTA negative for  PE.  He is an active person and has been at work all week, works in a [employer].  He denies any recent periods of immobilization, hemoptysis, previous DVT / PE, known malignancy.  He is an active smoker.   PAST MEDICAL HISTORY :   has a past medical history of Asthma and Hypertension.  has no past surgical history on file. Prior to Admission medications   Medication Sig Start Date End Date Taking? Authorizing Provider  amLODipine (NORVASC) 10 MG tablet Take 1 tablet (10 mg total) by mouth daily. 08/23/14  Yes Zadie Rhineonald Wickline, MD  furosemide (LASIX) 20 MG tablet Take 1 tablet (20 mg total) by mouth daily. 08/23/14  Yes Zadie Rhineonald Wickline, MD  azithromycin [facility](ZITHROMAX) 250 MG tablet One tablet PO daily for 4 days Patient not taking: Reported on 12/01/2014 08/23/14   Zadie Rhineonald Wickline, MD   Allergies  Allergen Reactions  . Lisinopril Swelling    FAMILY HISTORY:  family history is not on file. SOCIAL HISTORY:  reports that he has been smoking Cigarettes.  He has been smoking about 0.25 packs per day. He does not have any smokeless tobacco history on file. He reports that he drinks alcohol. He reports that he does not use illicit drugs.  REVIEW OF SYSTEMS:   All negative; except for those that are bolded, which indicate positives.  Constitutional: weight loss, weight gain, night sweats, fevers, chills, fatigue, weakness.  HEENT: headaches, sore throat, sneezing, nasal congestion, post nasal drip, difficulty swallowing, tooth/dental problems, visual complaints, visual changes, ear aches. Neuro: difficulty with speech, weakness, numbness, ataxia. CV:  chest pain, orthopnea, PND, swelling in lower extremities, dizziness, palpitations, syncope, chest discomfort with coughing. Resp: cough, hemoptysis, dyspnea, wheezing. GI  heartburn, indigestion, abdominal pain, nausea, vomiting, diarrhea, constipation, change in bowel habits, loss of appetite, hematemesis, melena, hematochezia.  GU: dysuria,  change in color of urine, urgency or frequency, flank pain, hematuria. MSK: joint pain or swelling, decreased range of motion, back ache. Psych: change in mood or affect, depression, anxiety, suicidal ideations, homicidal ideations. Skin: rash, itching, bruising.   SUBJECTIVE:   Feels that he can't breathe well.  Denies chest pain at this time.    VITAL SIGNS: Temp:  [99 F (37.2 C)-102.5 F (39.2 C)] 99.6 F (37.6 C) (03/10 0101) Pulse Rate:  [90-129] 128 (03/10 0113) Resp:  [26-38] 38 (03/10 0113) BP: (130-150)/(93-101) 150/101 mmHg (03/10 0101) SpO2:  [90 %-100 %] 92 % (03/10 0113) Weight:  [106.142 kg (234 lb)-109.816 kg (242 lb 1.6 oz)] 109.816 kg (242 lb 1.6 oz) (03/09 2335)  PHYSICAL EXAMINATION: General: WDWN AA male, resting in bed, in NAD. Neuro: A&O x 3, non-focal.  HEENT: [place]/AT. PERRL, sclerae anicteric. Cardiovascular: Tachy, regular, no M/R/G.  Lungs: Respirations shallow and rapid. Coarse BS bilaterally R > L, faint expiratory wheeze. Abdomen: BS x 4, soft, NT/ND.  Musculoskeletal: No gross deformities, 2+ pitting edema bilateral LE's to knees. Skin: Intact, warm, no rashes.    Recent Labs Lab 12/09/2014 1925 12-14-2014 0014  NA 135  --   K 3.5  --   CL 104  --   CO2 24  --   BUN 17  --   CREATININE 1.08 1.10  GLUCOSE 95  --     Recent Labs Lab 11/27/2014 1925 12/14/2014 0014  HGB 11.5* 11.9*  HCT 35.7* 36.3*  WBC 13.0* 12.8*  PLT 227 198   Dg Chest 2 View  11/27/2014   CLINICAL DATA:  Shortness of breath. Productive cough. Fever. Asthma. Hypertension.  EXAM: CHEST  2 VIEW  COMPARISON:  08/23/2014  FINDINGS: Moderate but stable enlargement of the cardiopericardial silhouette observed with indistinct pulmonary vasculature and hazy airspace opacities at the right lung base and in the right upper lobe. No pleural effusion.  IMPRESSION: 1. Moderate enlargement of the cardiopericardial silhouette with indistinct pulmonary vasculature favoring pulmonary venous  hypertension. Hazy airspace opacities in the right lung may represent asymmetric edema or pneumonia.   Electronically Signed   By: Gaylyn Rong M.D.   On: 11/27/2014 19:05   Ct Angio Chest Pe W/cm &/or Wo Cm  2014/12/14   CLINICAL DATA:  Extreme shortness of breath and weakness. Asthma and hypertension.  EXAM: CT ANGIOGRAPHY CHEST WITH CONTRAST  TECHNIQUE: Multidetector CT imaging of the chest was performed using the standard protocol during bolus administration of intravenous contrast. Multiplanar CT image reconstructions and MIPs were obtained to evaluate the vascular anatomy.  CONTRAST:  OMNIPAQUE IOHEXOL 350 MG/ML SOLN  COMPARISON:  None.  FINDINGS: Technically adequate study with good opacification of the central and proximal segmental pulmonary arteries. Peripheral vessels are not well evaluated due to motion artifact. No filling defects demonstrated in the visualized pulmonary arteries suggesting no evidence of significant central pulmonary embolus.  Cardiac enlargement. No pericardial effusion. Esophagus is decompressed. Moderately prominent lymphadenopathy in the aortopulmonic window and left paratracheal region. Lymph nodes measure up to about 16 mm diameter.  This is nonspecific and could be reactive although neoplastic change or lymphoproliferative disorders not excluded. Consider follow-up.  Evaluation of lungs is limited due to motion artifact but there is patchy airspace disease demonstrated throughout both lungs. This C could indicate edema or diffuse pneumonia. No pleural effusions. No pneumothorax.  Included portions of the upper abdominal organs are grossly unremarkable allowing for severe motion artifact. There is probably a cyst in the left kidney upper pole. The no destructive bone lesions are appreciated.  Review of the MIP images confirms the above findings.  IMPRESSION: No significant central pulmonary emboli are identified. Cardiac enlargement. Diffuse airspace disease  throughout both lungs. Nonspecific enlarged mediastinal lymph nodes. Motion artifact limits evaluation.   Electronically Signed   By: Burman Nieves M.D.   On: 19-Nov-2014 02:21    ASSESSMENT / PLAN:  Acute hypoxic respiratory failure Asthma with ? Exacerbation ? PNA Pulmonary edema Tobacco use disorder PE ruled out Recs: Continue supplemental O2 as needed to maintain SpO2 > 92%. Xopenex q3hrs PRN, tessalon pearls, guaifenesin.  2g Mag x 1 for additional bronchodilation. Low dose ativan for anxiety related to dyspnea. Continue empiric abx. Follow cultures. PCT algorithm to guide therapy and limit abx exposure. Aggressive diuresis as BP / SCr permit. Tobacco cessation counseling.  Troponin leak ? CHF LE edema ? Viral myocarditis Recs: Trend troponins. Echo. Aggressive diuresis as BP / SCr permit.  Rest per primary.   Dillon Valdez, [place] [facility] Pager: [phone]  or [phone] 11/19/2014, 3:26 AM    STAFF NOTE: I, Rory Percy, MD FACP have personally reviewed patient's available data, including medical history, events of note, physical examination and test results as part of my evaluation. I have discussed with resident/NP and other care providers such as pharmacist, RN and RRT. In addition, I personally evaluated patient and elicited key findings of: Concern is for sepsis, PNA, CT aldo concerning for septic emboli, unless this was early PNA areas, STAT zosyn, vanc, treat tamifli, levo for atypical, just intubated, get stat ABG, pcxr, place line, stya echo assess for myocarditis, concern viral etiology vs overwhleming bacterial infection, assess pct The patient is critically ill with multiple organ systems failure and requires high complexity decision making for assessment and support, frequent evaluation and titration of therapies, application of advanced monitoring technologies and extensive interpretation of multiple  databases.   Critical Care Time devoted to patient care services described in this note is40 Minutes. This time reflects time of care of this signee: Rory Percy, MD FACP. This critical care time does not reflect procedure time, or teaching time or supervisory time of PA/NP/Med student/Med Resident etc but could involve care discussion time. Rest per NP/medical resident whose note is outlined above and that I agree with   Mcarthur Rossetti. Tyson Alias, MD, FACP Pgr: [phone] [facility] 2014-11-19 6:40 AM

## 2014-12-10 NOTE — Progress Notes (Signed)
Patient was resting in bed and stated he felt short of breath and anxious. 1 mg of Ativan given and respiratory therapist was called. 02 sats were in the 80s nasal canula patient placed on nonrebreather. Patient received breathing treatment. MD on call made aware. Rapid was called. Patient became combative and kept ripping off his mask. He became more confused and agitated, security was called. Stat CXR was ordered. 2 mg of Ativan, 5mg  of Haldol, and 40 IV Lasix were given per on call MD at bedside. PCCM PA came to bedside also. Patient less agitated, sats dropping in 60s-70s, his lips were cyanotic. Foley placed per MD orders. Anesthesia came to bedside to intubate. After intubation, patient was transferred to [facility] on zoll monitor by Rapid Response Nurse, Respiratory, and bedside nurse.

## 2014-12-10 NOTE — Progress Notes (Signed)
Pt arrived to 2H14 at 0618.  Upon arrival pt noted to be PEA, MDs at bedside, CPR initiated and ACLS protocols followed, see code sheet. Unable to resuscitate, family notified and en route. Report given to AM RN, will page MD on family arrival

## 2014-12-10 DEATH — deceased

## 2015-02-02 NOTE — Discharge Summary (Signed)
Death Note Summary for 06-01-15   Brief Summary:  [name] is a 39 yo man with PMH of asthma and hypertension who had recently been treated for a respiratory illness before [facility] and [facility] diagnosis [facility] although he was being treated for pneumonia with elevated temperature. On arrival, he had clear respiratory symptoms and distress and pulmonary consulted with large work-up and broad spectrum antibiotics continued and further evaluation for source of infection. He required intubation for respiratory distress and was transferred to the CCU where he coded shortly after arrival with PEA arrest with cardiology and pulmonary at bedside. He had > 15 minutes of ACLS with treatment for potential electrolyte and metabolic abnormalities among others with some brief episodes of VT, VF that became asystole and PEA. He ultimately was pronounced dead at 6:29 AM on 2015/03/11. Family and chaplain notified.   Underlying cause of death: Respiratory Failure and Acute Respiratory Distress Syndromes (ARDS) presumably to viral or bacterial infection Immediate Cause of death: Pulseless electrical activity arrest and cardiopulmonary arrest Time of death 6:29 AM on 06-01-15.    Leeann MustJacob Mekisha Bittel, MD

## 2016-04-17 IMAGING — CR DG CHEST 2V
2 series · 2 of 2 positions shown · non-contrast
Comparison: 07/25/2014

CLINICAL DATA: Persistent cough. Recent pneumonia. Shortness of
breath. Hypertension.

EXAM:
CHEST  2 VIEW

[view not recorded (1 of 2)]
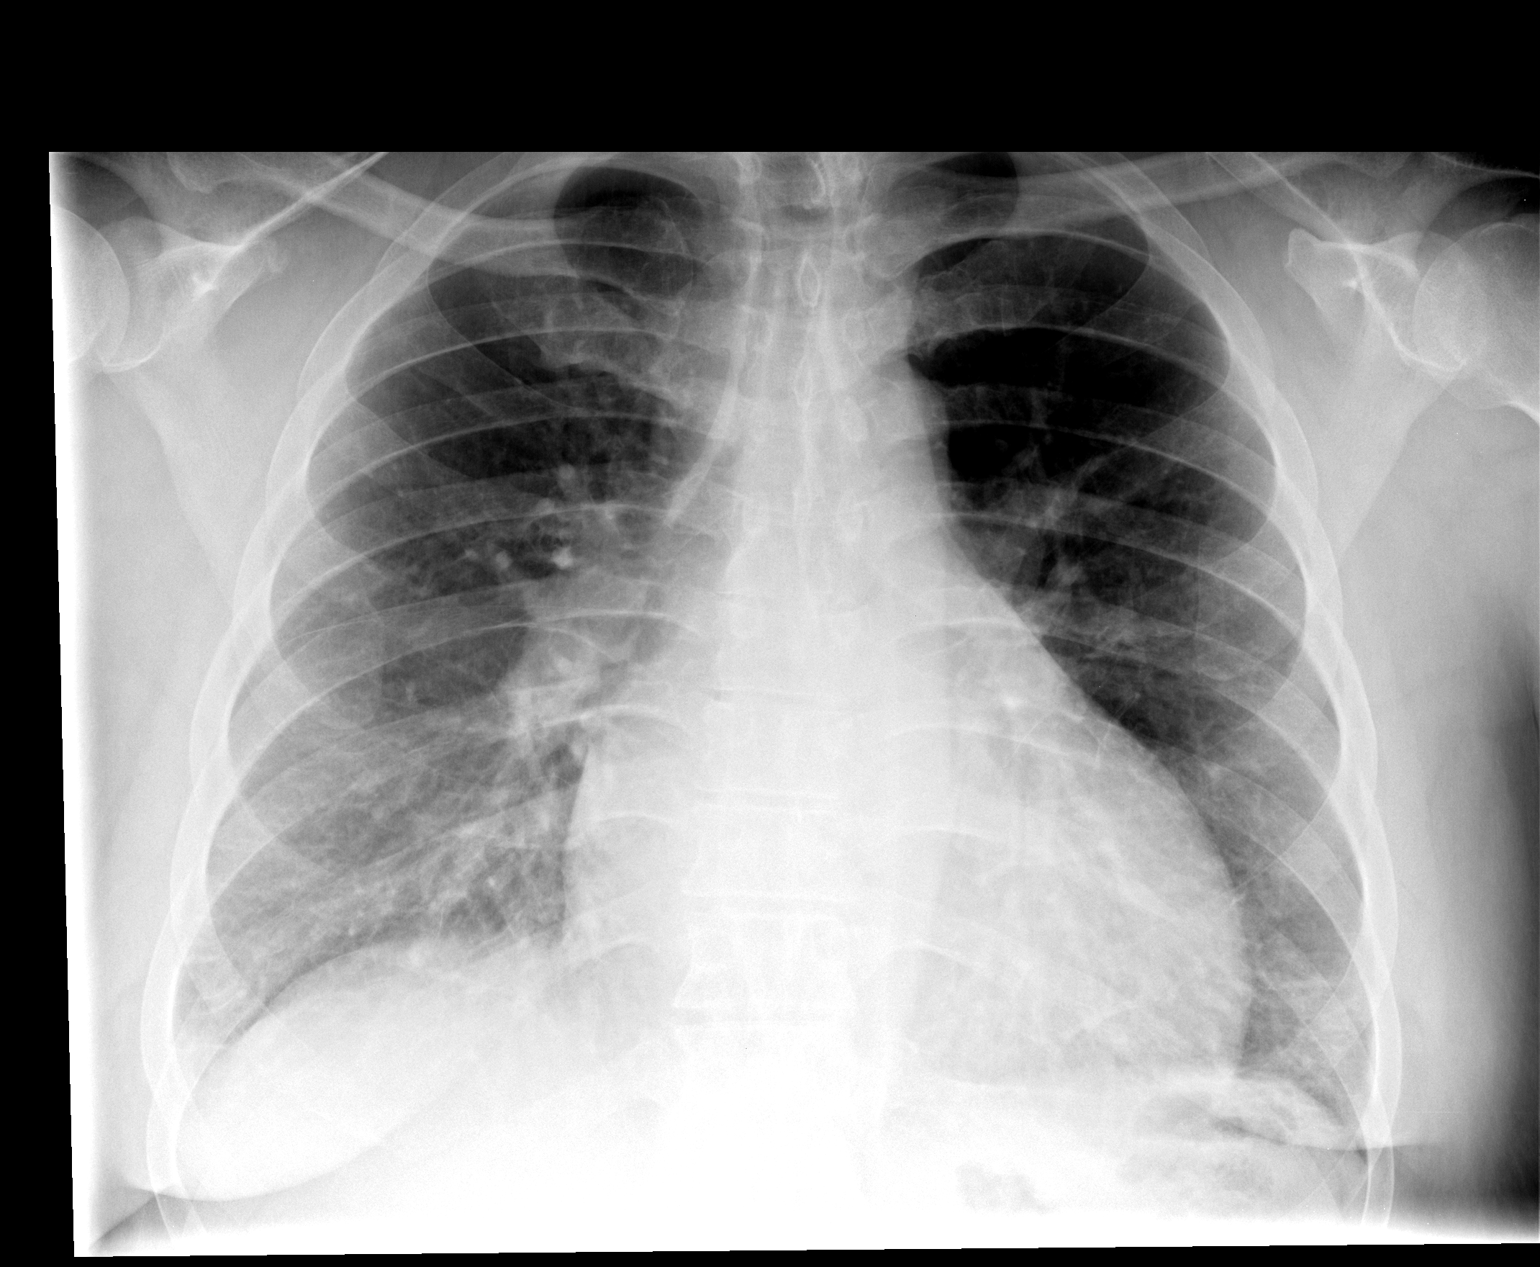

[view not recorded (2 of 2)]
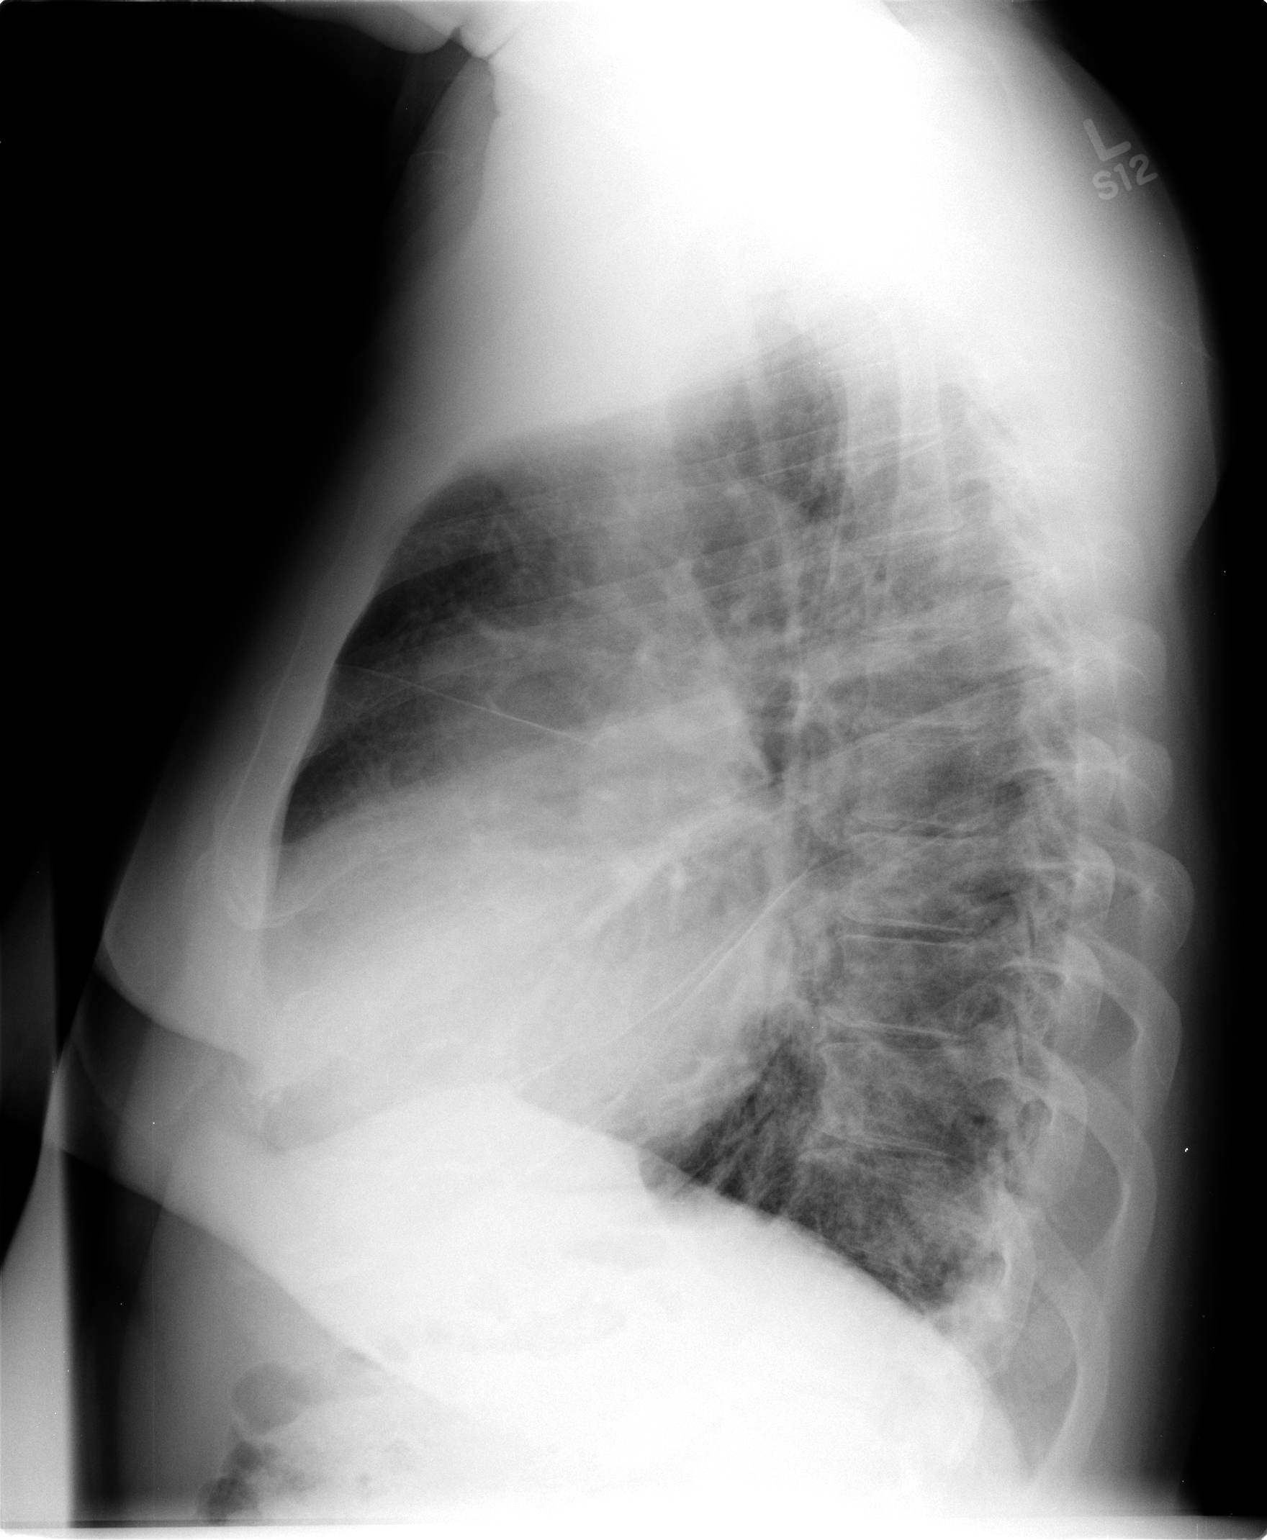

[2 of 2 positions shown; findings below may reference images not displayed]

FINDINGS: There is borderline cardiomegaly. The patchy infiltrate in the left
lung has almost cleared. There is slight accentuation of the
interstitial markings at both lung bases laterally. No effusions.
Pulmonary vascularity is normal. Slight upper thoracic scoliosis,
stable.
IMPRESSION: Almost complete clearing of the infiltrate in the left lung. New
slight bibasilar atelectasis. New slight cardiomegaly.
